# Patient Record
Sex: Male | Born: 1935 | Race: White | Hispanic: No | Marital: Married | Smoking: Former smoker
Health system: Southern US, Community
[De-identification: ages and names within clinical notes are randomized; demographics above are authoritative.]

## PROBLEM LIST (undated history)

## (undated) DIAGNOSIS — E039 Hypothyroidism, unspecified: Secondary | ICD-10-CM

## (undated) DIAGNOSIS — H547 Unspecified visual loss: Secondary | ICD-10-CM

## (undated) DIAGNOSIS — E119 Type 2 diabetes mellitus without complications: Secondary | ICD-10-CM

## (undated) DIAGNOSIS — E785 Hyperlipidemia, unspecified: Secondary | ICD-10-CM

## (undated) DIAGNOSIS — C801 Malignant (primary) neoplasm, unspecified: Secondary | ICD-10-CM

## (undated) HISTORY — PX: CHOLECYSTECTOMY: SHX55

## (undated) HISTORY — PX: OTHER SURGICAL HISTORY: SHX169

---

## 2000-10-26 ENCOUNTER — Encounter: Payer: Self-pay | Admitting: *Deleted

## 2000-10-26 ENCOUNTER — Emergency Department (HOSPITAL_COMMUNITY): Admission: EM | Admit: 2000-10-26 | Discharge: 2000-10-26 | Payer: Self-pay | Admitting: *Deleted

## 2000-10-31 ENCOUNTER — Inpatient Hospital Stay (HOSPITAL_COMMUNITY): Admission: RE | Admit: 2000-10-31 | Discharge: 2000-11-03 | Payer: Self-pay | Admitting: Internal Medicine

## 2004-10-26 ENCOUNTER — Ambulatory Visit (HOSPITAL_COMMUNITY): Admission: RE | Admit: 2004-10-26 | Discharge: 2004-10-26 | Payer: Self-pay | Admitting: Internal Medicine

## 2005-06-13 ENCOUNTER — Ambulatory Visit (HOSPITAL_COMMUNITY): Admission: RE | Admit: 2005-06-13 | Discharge: 2005-06-13 | Payer: Self-pay | Admitting: Internal Medicine

## 2005-06-13 ENCOUNTER — Ambulatory Visit: Payer: Self-pay | Admitting: Internal Medicine

## 2005-06-13 ENCOUNTER — Encounter (INDEPENDENT_AMBULATORY_CARE_PROVIDER_SITE_OTHER): Payer: Self-pay | Admitting: *Deleted

## 2005-12-05 ENCOUNTER — Inpatient Hospital Stay (HOSPITAL_COMMUNITY): Admission: EM | Admit: 2005-12-05 | Discharge: 2005-12-12 | Payer: Self-pay | Admitting: Emergency Medicine

## 2005-12-05 ENCOUNTER — Ambulatory Visit: Payer: Self-pay | Admitting: Internal Medicine

## 2007-04-29 ENCOUNTER — Ambulatory Visit: Payer: Self-pay | Admitting: Orthopedic Surgery

## 2007-04-29 DIAGNOSIS — M24549 Contracture, unspecified hand: Secondary | ICD-10-CM

## 2007-04-30 ENCOUNTER — Telehealth: Payer: Self-pay | Admitting: Orthopedic Surgery

## 2007-05-08 ENCOUNTER — Encounter: Payer: Self-pay | Admitting: Orthopedic Surgery

## 2008-10-30 ENCOUNTER — Ambulatory Visit (HOSPITAL_COMMUNITY): Admission: RE | Admit: 2008-10-30 | Discharge: 2008-10-30 | Payer: Self-pay | Admitting: Internal Medicine

## 2008-11-09 ENCOUNTER — Ambulatory Visit (HOSPITAL_COMMUNITY): Admission: RE | Admit: 2008-11-09 | Discharge: 2008-11-09 | Payer: Self-pay | Admitting: Internal Medicine

## 2008-11-20 ENCOUNTER — Encounter: Payer: Self-pay | Admitting: Internal Medicine

## 2008-11-23 ENCOUNTER — Encounter (INDEPENDENT_AMBULATORY_CARE_PROVIDER_SITE_OTHER): Payer: Self-pay | Admitting: Interventional Radiology

## 2008-11-23 ENCOUNTER — Ambulatory Visit (HOSPITAL_COMMUNITY): Admission: RE | Admit: 2008-11-23 | Discharge: 2008-11-23 | Payer: Self-pay | Admitting: Internal Medicine

## 2008-12-02 ENCOUNTER — Ambulatory Visit (HOSPITAL_COMMUNITY): Admission: RE | Admit: 2008-12-02 | Discharge: 2008-12-02 | Payer: Self-pay | Admitting: Internal Medicine

## 2008-12-07 ENCOUNTER — Encounter: Payer: Self-pay | Admitting: Interventional Radiology

## 2009-06-14 ENCOUNTER — Encounter: Payer: Self-pay | Admitting: Interventional Radiology

## 2009-06-21 ENCOUNTER — Ambulatory Visit (HOSPITAL_COMMUNITY): Admission: RE | Admit: 2009-06-21 | Discharge: 2009-06-21 | Payer: Self-pay | Admitting: Interventional Radiology

## 2010-04-25 LAB — CREATININE, SERUM
Creatinine, Ser: 0.81 mg/dL (ref 0.4–1.5)
GFR calc Af Amer: >60 mL/min (ref >60)
GFR calc non Af Amer: >60 mL/min (ref >60)

## 2010-05-12 LAB — BASIC METABOLIC PANEL
BUN: 13 mg/dL (ref 6–23)
Creatinine, Ser: 0.74 mg/dL (ref 0.4–1.5)
GFR calc non Af Amer: >60 mL/min (ref >60)
Glucose, Bld: 133 mg/dL — ABNORMAL HIGH (ref 70–99)

## 2010-05-12 LAB — APTT: aPTT: 26 seconds (ref 24–37)

## 2010-05-12 LAB — GLUCOSE, CAPILLARY
Glucose-Capillary: 111 mg/dL — ABNORMAL HIGH (ref 70–99)
Glucose-Capillary: 115 mg/dL — ABNORMAL HIGH (ref 70–99)

## 2010-05-12 LAB — CBC
Platelets: ADEQUATE 10*3/uL (ref 150–400)
RDW: 12.8 % (ref 11.5–15.5)

## 2010-05-12 LAB — PROTIME-INR: INR: 0.91 (ref 0.00–1.49)

## 2010-06-24 NOTE — Group Therapy Note (Signed)
[name], [name]                ACCOUNT NO.:  [id number]   MEDICAL RECORD NO.:  [id number]          PATIENT TYPE:  INP   LOCATION:  A327                          FACILITY:  [facility]   PHYSICIAN:  Angus G. Renard Matter, MD   DATE OF BIRTH:  [birth year]   DATE OF PROCEDURE:  12/07/2005  DATE OF DISCHARGE:                                   PROGRESS NOTE   This patient was admitted with upper abdominal pain. He had an abnormal CT  of the abdomen with a 3.5 x 2.2-cm ill-defined lesion on the inferior margin  of the uncinate process of the pancreas. Differential diagnosis is focal  pancreatitis, pseudocyst, pancreatic carcinoma.  The patient did have EGD  yesterday which showed a small hiatal hernia, no neoplasm or ulcer seen. [name], M.D. suspected focal pancreatitis but neoplasm could not  completely be ruled out.   OBJECTIVE:  VITAL SIGNS:  Blood pressure 132/66, respirations 20, pulse 84,  temperature 98.5, blood sugars have run from 135-145.  LUNGS:  Clear to P&A.  HEART:  Regular rhythm.  ABDOMEN:  No palpable organs or masses.   ASSESSMENT:  The patient was admitted with above stated problems. Plan is to  continue current regimen.  Continue pain control.  Continue to monitor  diabetes. The patient as stated in consult note will need endoscopic  ultrasound at [facility] in 3 weeks. Continue current regimen.      Angus G. Renard Matter, MD  Electronically Signed     AGM/MEDQ  D:  12/07/2005  T:  12/07/2005  Job:  [id number]

## 2010-06-24 NOTE — Group Therapy Note (Signed)
[name], [name]                ACCOUNT NO.:  [id number]   MEDICAL RECORD NO.:  [id number]          PATIENT TYPE:  INP   LOCATION:  A327                          FACILITY:  [facility]   PHYSICIAN:  Angus G. McInnis, MD   DATE OF BIRTH:  1926-11-30   DATE OF PROCEDURE:  DATE OF DISCHARGE:                                   PROGRESS NOTE   This patient has been followed for what is felt to be focal pancreatitis,  although malignancy has not been totally ruled out.  He developed fever and  rigors 2 days ago but subsequently temperature has been normal and he has  been more comfortable.  He did have on x-ray of chest what was felt to be  developing right lower lobe infiltrate.  Pneumonia could not be excluded.  He did have increase in his white blood count.  Most current CBC shows white  count 9000 with 86 neutrophils, 8 lymphocytes and slightly low serum  potassium at 3.3.   OBJECTIVE:  VITAL SIGNS:  Blood pressure 149/72, respirations 20, pulse 60,  temperature 98.3.  Blood sugars have ranged from 138-244.  LUNGS:  Diminished breath sounds.  HEART:  Regular rhythm.  ABDOMEN:  No palpable organs or masses.   ASSESSMENT:  The patient has had above-stated problems.  He did develop  fever and probable sepsis secondary to pneumonia.  He does have atelectasis  and pleural effusion on the right.  Plan to continue current IV antibiotics.  Continue current stated regimen.      Angus G. Renard Matter, MD  Electronically Signed     AGM/MEDQ  D:  12/10/2005  T:  12/10/2005  Job:  [id number]

## 2010-06-24 NOTE — Group Therapy Note (Signed)
NAME:  [name], [name]                ACCOUNT NO.:  [id number]   MEDICAL RECORD NO.:  [id number]          PATIENT TYPE:  INP   LOCATION:  A327                          FACILITY:  [facility]   PHYSICIAN:  Catalina Pizza, M.D.        DATE OF BIRTH:  [birth year]   DATE OF PROCEDURE:  DATE OF DISCHARGE:                                   PROGRESS NOTE   PROGRESS NOTE:   SUBJECTIVE:  [name] is a 77 year old admitted with acute epigastric  right sided abdominal pain, initially felt secondary to acute pancreatitis  with question of pancreatic cyst but again had another episode of shaking  chills this morning and was seen yesterday by Dr. Karilyn Cota and felt that not  exactly consistent with pancreatitis, so did have contrast given this  morning orally and repeat CT scan which revealed what is believed to be some  type of bowel rupture likely walled off in nature and was seen and evaluated  by Dr. Karilyn Cota at this time.  The patient states that he has decrease in pain  and decrease in nausea at this time from previous.  He denies any specific  chest pain.   OBJECTIVE:  VITAL SIGNS:  Temperature is 98.3, with a T-max of 99.5, blood  pressure has been ranging from 160/88 to 173/88, pulse is 74.  CBG has been  202, 136, 85 respectively.  Saturation 94% on room air.  Respiratory rate is  approximately 20.  GENERAL:  This is an elderly white male lying in bed.  Appears in no acute  distress, mildly flushed appearance.  Somnolent at this time.  HEENT:  Unremarkable.  LUNGS:  A few crackles at the bases bilaterally.  Decreased aeration at the  right lower lobe.  CARDIOVASCULAR:  Regular rate and rhythm.  No murmurs, gallops, or rubs.  ABDOMEN:  Mildly protuberant.  Does have some right lower abdominal pain to  point tenderness as well as some in the right upper quadrant as well, not as  much in the epigastric area as previously.  Does have positive bowel sounds  but hypoactive.  EXTREMITIES:  No lower  extremity edema.  NEUROLOGIC:  No specific deficit appreciated.   LABORATORY WORK:  BNP of 302.  Amylase 27.  Lipase 14.  CBC showed a white  count of 9.1, hemoglobin of 13.2, platelet count of 203 but a significant  left shift with ANC of 8.3.  Magnesium level was 2.  And, B-MET this morning  shows sodium 136, potassium 3.5, chloride 98, CO2 28, glucose 25, BUN 5,  creatinine 0.8, calcium 8.4.   Chest x-ray shows no interval change from previous, does show bilateral  effusions but states that they are stable.   CT of abdomen not yet read at this time but does show some considerable  abnormality in the right duodenal area with possible walled off abscess,  looks like likely perforation in that area and also it shows the pleural  effusions at this time.   IMPRESSION:  This 77 year old gentleman was initially felt to have acute  pancreatitis which  apparently now appears to be likely a walled off abscess  from perforation in the duodenum, ?.   ASSESSMENT/PLAN:  1. Question duodenal perforation.  In discussion with Dr. Karilyn Cota, we will      get further surgery evaluation by Dr. Malvin Johns to see if would like to      perform the surgery or if needs to be sent to a tertiary facility.  We      will broaden his antibiotic coverage with Unasyn at this time.  We will      place an nasogastric tube and decompress the stomach.  2. Type 2 diabetes.  We will continue to monitor closely with sliding      scale insulin for now, given that he will be nothing by mouth related      to surgery.  3. Headache, question whether related to mild low grade sepsis.  We will      continue with as-needed pain medicine for this.  4. Mild sepsis initially felt secondary to pneumonia but likely due to      this perforation.  He does have signs of pleural effusions which may be      reactive in nature and did have a slightly elevated BNP and we will      continue to monitor.  They do appear to be stable at this  time.  5. Elevated troponin.  This is likely due to stress from illness as well      as some stress/strain on the heart.  We will get a cardiologist's      opinion on any pre-op clearance or workup needed from cardiology      standpoint.  Given the slight bump in troponin, he likely did have some      ischemia but this may be related to his sepsis.  6. Hypokalemia.  He was replenished with his potassium and it is at the      low end of normal at this time.  We will need to recheck in the      morning.   DISPOSITION:  We will make sure the patient has good intravenous access,  broaden spectrum on his antibiotics, and we will transfer down to 2A for  closer monitoring given his mild sepsis type picture.      Catalina Pizza, M.D.  Electronically Signed     ZH/MEDQ  D:  12/12/2005  T:  12/12/2005  Job:  [id number]

## 2010-06-24 NOTE — Op Note (Signed)
[name], [name]                ACCOUNT NO.:  [id number]   MEDICAL RECORD NO.:  [id number]          PATIENT TYPE:  AMB   LOCATION:  DAY                           FACILITY:  [facility]   PHYSICIAN:  R. Roetta Sessions, M.D. DATE OF BIRTH:  06-Jun-1926   DATE OF PROCEDURE:  06/13/2005  DATE OF DISCHARGE:                                 OPERATIVE REPORT   PROCEDURE:  Colonoscopy with polypectomy.   INDICATIONS FOR PROCEDURE:  Patient is a pleasant 77 year old Caucasian male  with chronic constipation, referred by Dr. Catalina Pizza for colorectal cancer  screening.  He has never had his lower GI tract evaluated.  There is no  family history of colorectal neoplasia.  Colonoscopy is now being done.  This approach has been discussed with the patient at length.  Potential  risks, benefits and alternatives have been reviewed and questions answered.  He is agreeable.  Please see documentation on the medical record.   PROCEDURE NOTE:  O2 saturation, blood pressure, pulses, and respirations  were monitored throughout the entire procedure.  Conscious sedation with  Versed 2 mg IV, Demerol 50 mg IV.   INSTRUMENT:  Olympus video chip system.   FINDINGS:  Digital rectal exam revealed no abnormalities.   ENDOSCOPIC FINDINGS:  Prep was adequate.   RECTAL:  Examination of the rectal mucosa, including retroflexion of the  anal verge, revealed no abnormalities.   COLON:  The colonic mucosa was surveyed from the rectosigmoid junction  through the left transverse, right colon, to the area of the appendiceal  orifice, the ileocecal valve, and cecum.  These structures were well seen  and photographed for the record.  From this level, the scope was slowly  withdrawn.  All previously mentioned mucosal surfaces were again seen.  The  following abnormalities were noted:  1)  Scattered sigmoid diverticula.  2)  A 6 mm pedunculated polyp at the hepatic flexure.  The polyp was cold-snared  and recovered through the  scope.  The remainder of the colonic mucosa  appeared entirely normal.  The patient tolerated the procedure well and was  reactive.   ENDOSCOPY IMPRESSION:  1.  Normal rectum.  2.  Left-sided diverticula, polyp at the hepatic flexure, cold snared.  The      remainder of the colonic mucosa appeared normal.   RECOMMENDATIONS:  1.  Diverticulosis and constipation literature was provided to [name].  2.  Daily Metamucil and Citrucel fiber supplements.  3.  MiraLax 17 gm orally at bedtime p.r.n. constipation.  4.  Follow up on path.  5.  Further recommendations to follow.      [name], M.D.  Electronically Signed     RMR/MEDQ  D:  06/13/2005  T:  06/13/2005  Job:  [id number]   cc:   Catalina Pizza, M.D.  Fax: [phone]

## 2010-06-24 NOTE — Consult Note (Signed)
NAMEZAKEE, DEERMAN                ACCOUNT NO.:  [id number]   MEDICAL RECORD NO.:  [id number]          PATIENT TYPE:  INP   LOCATION:  A326                          FACILITY:  [facility]   PHYSICIAN:  R. Roetta Sessions, M.D. DATE OF BIRTH:  [birth year]   DATE OF CONSULTATION:  12/06/2005  DATE OF DISCHARGE:                                   CONSULTATION   REASON FOR CONSULTATION:  Abdominal pain.   REQUESTING PHYSICIAN:  Catalina Pizza.   HISTORY OF PRESENT ILLNESS:  The patient is a 77 year old Caucasian  gentleman with acute onset right-sided abdominal pain. Symptoms began  yesterday and were progressive. They became so severe he came to the  emergency department. He denies any associated nausea or vomiting, diarrhea,  melena or rectal bleeding. He has chronic constipation at baseline. Denies  any fevers or chills. He had a CT of the abdomen and pelvis which revealed  ectopic position of the right kidney in the pelvis and a 3.5 x 2.2 cm, ill-  defined, low-attentuation lesion along the inferior margin of the uncinate  process of the pancreas which involved the transverse duodenum. Differential  diagnosis was listed as focal pancreas with pseudocyst, pancreatic  carcinoma, duodenal ulcer mound or duodenal neoplasm. His lipase and LFTs  were all normal. White count normal at 9700. Hemoglobin 14.9. He consumes  about 4 to 5 beers on the weekend; in his youth, he was a more frequent  drinker but no daily use in over 40 years. He denies any history of peptic  ulcer disease or pancreatitis.   MEDICATIONS AT HOME:  1. Metformin 500 mg b.i.d.  2. Lisinopril 5 mg daily.  3. Multivitamin daily.  4. Folic acid daily.  5. Crestor daily.  6. MiraLax every night.  7. Aspirin 81 mg daily.  8. Tylenol p.r.n.   ALLERGIES:  PHENERGAN CAUSES VISUAL HALLUCINATIONS.   PAST MEDICAL HISTORY:  1. 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  2. Diabetes mellitus.  3. Hypercholesterolemia.  4.  Status post cholecystectomy in 2002.  5. He has a small umbilical hernia.  6. He had a colonoscopy in May of 2007 by Dr. Jena Gauss; revealed left-sided      diverticular and hepatic flexure polyp which was hyperplastic.   FAMILY HISTORY:  Negative for chronic GI illnesses, colorectal cancer,  pancreatitis or other GI malignancies.   SOCIAL HISTORY:  Is married and has one daughter. He quit smoking 10 years  ago. He continues to chew tobacco. He drinks 4 to 5 beers per weekend.   REVIEW OF SYSTEMS:  See HPI for GI. CONSTITUTIONAL:  No weight loss.  CARDIOPULMONARY:  No chest pain or shortness of breath. GENITOURINARY:  Complains of urinary hesitancy.   PHYSICAL EXAMINATION:  Temperature 99.8, pulse 87, respirations 20, blood  pressure 143/84. Weight 70.4 kg, height 69 inches.  GENERAL:  Pleasant, somewhat hard of hearing, Caucasian male in no acute  distress.  SKIN:  Warm and dry. No jaundice.  HEENT:  Sclerae nonicteric. Oropharyngeal mucosa moist and pink. No  lymphadenopathy or thyromegaly.  CHEST:  Lungs are clear to auscultation.  CARDIAC EXAMINATION:  Reveals regular rate and rhythm, normal S1 and S2. No  murmurs, rubs, or gallops.  ABDOMEN:  Positive bowel sounds. Abdomen is soft, nondistended. Positive  bowel sounds. He has moderate tenderness throughout the entire right abdomen  and epigastrium to deep palpation. No organomegaly or masses. Subjective  guarding. No rebound tenderness. Small umbilical hernia with a pea-sized  hardening which is not reducible and slightly tender.  EXTREMITIES:  No edema.   LABORATORY DATA:  As outlined above. In addition, platelets 195,000, sodium  141, potassium 4, BUN 9, creatinine 0.8, glucose 116. Total bilirubin 0.6,  alkaline phosphatase 54, AST 18, ALT 14, albumin 4.3, lipase 33.   IMPRESSION:  The patient is a 77 year old gentleman with acute onset of  right-sided abdominal pain with abnormal CT with ill-defined mass as  outlined above.  The patient's pain is right sided, somewhat atypical given  location noted on CT. He also has a small umbilical hernia which is likely  incidental. Dr. Jena Gauss to review CT. Further recommendations to follow but  suspect patient will need EUS.      Tana Coast, P.AJonathon Bellows, M.D.  Electronically Signed    LL/MEDQ  D:  12/06/2005  T:  12/06/2005  Job:  [id number]   cc:   Catalina Pizza, M.D.  Fax: [phone]

## 2010-06-24 NOTE — Group Therapy Note (Signed)
NAME:  LANEY, BAGSHAW                ACCOUNT NO.:  [id number]   MEDICAL RECORD NO.:  [id number]          PATIENT TYPE:  INP   LOCATION:  A327                          FACILITY:  [facility]   PHYSICIAN:  Catalina Pizza, M.D.        DATE OF BIRTH:  February 16, 1926   DATE OF PROCEDURE:  12/11/2005  DATE OF DISCHARGE:                                   PROGRESS NOTE   SUBJECTIVE:  Mr. Salzwedel is a 77 year old gentleman admitted with acute  epigastric right-sided abdominal pain, found to have a 4-cm pancreatic cyst,  believed to be an acute pancreatitis.  Since last dictation, he began having  rigors and shaking chills  on Friday, and felt to have mild sepsis at that  time, and was started on antibiotics.  Since then, he has grown out gram-  negative rod in 1 of 2 blood cultures.  This improved and he did not have  any significant further rigors, but did have elevated temperature of  approximately 103 at that time.  Unclear cause of this mild sepsis.  Did get  cardiac enzymes apparently, and did show some increase in troponin-I, and  appears that he just had some mild ischemia.  He denies any specific chest  pain related to this.  Has had a chest x-ray obtained on the 2nd, which  revealed some bibasilar atelectasis and small right effusion, and had a CT  done the next day which showed negative for pulmonary embolism.  Did show  pleural effusions, right greater than left, with compressive atelectasis.  Unclear whether this is related to either early pneumonia or signs of  congestive heart failure.  Biggest complaints at this time is he continues  to have a period headache, as well as continued nausea and mild abdominal  pain.   OBJECTIVE:  VITAL SIGNS:  Temperature 99.1, T-max last night was 101.3,  blood pressure 150/73, pulse 64, respirations 16.  CBG has been 107, 130,  167.  Sating 92% on room air.  GENERAL:  This is an elderly white male, lying in bed in no acute distress.  Mildly flushed  appearance.  HEENT:  Unremarkable.  Mucous membranes are moist.  No thyromegaly.  No JVD  appreciated.  LUNGS:  A few crackles at the bases bilaterally.  No specific signs of  consolidation on exam.  CARDIOVASCULAR:  Regular rate and rhythm.  No murmurs, gallops, or rubs  appreciated.  ABDOMEN:  Diffuse tenderness throughout.  Some mild protuberance.  No mass  is appreciated.  Positive bowel sounds throughout.  EXTREMITIES:  No lower extremity edema.  SKIN:  No signs of rash.  NEUROLOGIC:  Cranial nerves 2 through 12 intact.  No deficits appreciated.   Lab work obtained as mentioned above, 1 of 2 blood cultures revealed gram-  negative rods in preliminary report from November 2.  BMET shows sodium 138,  potassium 3, chloride 97, CO2 31, glucose 121, BUN 3, creatinine 0.8,  calcium 7.5.  CBC showed a white count of 9.4, hemoglobin 12.1, platelet  count 200.   IMPRESSION:  This 77 year old gentleman  with a 3.5-cm x 2.2-cm, ill-defined  lesion in the pancreas, felt secondary to pancreatitis, who apparently has  had a bout of mild sepsis, and question whether had some coronary event as  well with elevated troponin.   ASSESSMENT AND PLAN:  1. Acute focal pancreatitis.  He continues to have some nausea, requiring      routine Zofran, and has not had much appetite, but is drinking fluids      well without difficulty.  Defer any further treatment decisions to      Gastrointestinal.  As far as the pancreatitis is concerned, will need      to be scheduled to go to [facility] for an endoscopic ultrasound of the      pancreas to further define this in approximately 3 weeks.  2. Type 2 diabetes.  Will continue on his current insulin regimen at this      time.  Blood sugars appear to be doing well.  He has had not      significant appetite, so has not had very much hyperglycemia since      starting on sliding scale.  His last A1c was 6.2 in October, and was      only on metformin at that time.  3.  Elevated troponin.  Question whether related to some mild congestive      heart failure given the pleural effusions, as well as whether having      some ischemic type event mild on Friday, but will get Cardiology offer      any further opinion and treatment options for this.  He has been risk      stratified before, and his blood pressure and cholesterol have been      recently checked, and under good control on previous medicines.  4. Headache.  Unclear exact cause of this.  He has had off and on      headaches and takes medicine for sinus type infection.  Will continue      with the Darvocet p.r.n. for this.  Tylenol apparently does not help      with this.  5. Question of a mild sepsis secondary to pneumonia.  There is no specific      source, but did have a question on chest x-ray.  We will repeat chest x-      ray in the morning, and see if any further signs of pneumonia.  He was      started on Levaquin and he has not had any significant fever since      episode on Friday.  We will follow up on results from the blood      culture, specifically find out what type bug he had.  6. Hypokalemia.  We will replete his potassium with runs of potassium due      to his nausea.  Feel may be made worse by potassium.  We will replete      and recheck in the morning as well as magnesium level.      Catalina Pizza, M.D.  Electronically Signed     ZH/MEDQ  D:  12/11/2005  T:  12/11/2005  Job:  [id number]

## 2010-06-24 NOTE — Op Note (Signed)
[name], [name]                ACCOUNT NO.:  [id number]   MEDICAL RECORD NO.:  [id number]          PATIENT TYPE:  INP   LOCATION:  A326                          FACILITY:  [facility]   PHYSICIAN:  R. Roetta Sessions, M.D. DATE OF BIRTH:  1926-07-18   DATE OF PROCEDURE:  12/06/2005  DATE OF DISCHARGE:                                 OPERATIVE REPORT   PROCEDURE:  Diagnostic EGD.   INDICATIONS FOR PROCEDURE:  The patient is a 76 year old gentleman with  acute onset right-sided abdominal pain. CT demonstrates a mass very close to  and possibly following the uncinate process, some possible involvement of  the third portion of the duodenum.  I reviewed the OCD films with Dr. Alver Fisher.  We would favor this lesion primarily arising from the pancreas.  His  amylase, lipase, and LFTs are notably normal.  He has had symptoms  intermittently previously. EGD is now being done to rule out a mucosal  process of the duodenum.  This approach has been discussed with the patient  at length.  Potential risks, benefits, and alternatives have been reviewed,  questions answered, and he is agreeable.  Please see documentation in the  medical record.   PROCEDURE NOTE:  O2 saturation, blood pressure, and pulse oximetry monitored  throughout the entire procedure.   CONSCIOUS SEDATION:  Versed 1 mg IV, Demerol 25 mg IV in divided doses.   INSTRUMENT:  Olympus video chip system.   FINDINGS:  Examination of the tubular esophagus revealed no mucosal  abnormalities.  EG junction easily traversed.  Stomach:  Gastric cavity was  emptied, insufflated well with air.  Thorough examination of the gastric  mucosa revealed retroflexion in the proximal stomach.  Esophagogastric  junction demonstrated only a small hiatal hernia.  Pylorus was patent,  easily traversed.  Examination of the bulb, second, and third portion was  undertaken.  There was some mucosal edema versus some bulging from extrinsic  compression along the  medial wall of the third portion of the duodenum.  However, the mucosa itself appeared entirely normal.   THERAPEUTIC/DIAGNOSTIC MANEUVERS PERFORMED:  None.   The patient tolerated the procedure well and was reactivated.   ENDOSCOPIC IMPRESSION:  Normal esophagus small hiatal hernia, otherwise  normal stomach. Subtle submucosal edema versus bulging medial wall of the  third portion of duodenum, otherwise negative upper GI tract.   DISCUSSION:  I suspect focal pancreatitis versus a neoplastic process  (favor of the former, with the acute onset of symptoms, but notably the  serum amylase and lipase were initially normal).   RECOMMENDATIONS:  Would treat clinically as a pancreatitis for the time  being.  Will repeat serum amylase and lipase today.  The patient will need  an endoscopic ultrasound at [facility] in 3-4 weeks. If this is benign  pancreatitis, the pancreatitis needs to settle down prior to attempting  endoscopic ultrasound because it is very difficult to sort out benign  pancreatic inflammation from neoplasia when inflammation is severe.      [name], M.D.  Electronically Signed     RMR/MEDQ  D:  12/06/2005  T:  12/06/2005  Job:  [id number]   cc:   Catalina Pizza, M.D.  Fax: [phone]

## 2010-06-24 NOTE — Discharge Summary (Signed)
[name], [name]                ACCOUNT NO.:  [id number]   MEDICAL RECORD NO.:  [id number]          PATIENT TYPE:  INP   LOCATION:  A327                          FACILITY:  [facility]   PHYSICIAN:  Catalina Pizza, M.D.        DATE OF BIRTH:  1926/09/14   DATE OF ADMISSION:  12/06/2005  DATE OF DISCHARGE:  11/06/2007LH                               DISCHARGE SUMMARY   DISCHARGE DIAGNOSES:  1. Duodenal perforation with abscess.  2. Mild septicemia.  3. Elevated troponin with mild ischemia.  4. Headache.  5. Type 2 diabetes.  6. Hypokalemia  7. Hypertension.  8. Hypercholesterolemia.   DISCHARGE MEDICATIONS:  1. Sliding-scale insulin.  2. Protonix 40 mg p.o. daily.  3. Benadryl 25 mg p.o. nightly.  4. Albuterol 2.5 mg inhaled q.4 h. p.r.n.  5. Atrovent 0.5 mg inhaled q.6 h. p.r.n..  6. Potassium chloride 20 mEq p.o. daily.  7. Unasyn unknown dose.   At time of discharge, the patient was transferred to [facility] and many of  his other medications were not resumed until the patient was more  stable, so I have an incomplete list at this time.   BRIEF HISTORY OF PRESENT ILLNESS:  [name] is a 77 year old  gentleman who presented on October 31 with complaint of severe abdominal  pain and right-sided in nature and was having a belching-type feeling,  no significant nausea or vomiting, denied any problems with stools.  Initial CT scan did show a 3.5 x 2.2-cm low-attenuated lesion in the  head of the pancreas involving the transverse duodenum, initially felt  secondary to pancreatitis.  He was seen and evaluated by GI and  continued with n.p.o. status and pain management for pancreatitis, but  did not have any significant improvement.  He did have several episodes  of shaking-type chills and reassessed with further CT scan after Dr.  Karilyn Cota had reevaluated the patient and felt that this more likely  related to perforation, did have some free air seen on scan and the  patient was  transferred to [facility] for further studies and intervention,  accepted by Dr. Lorin Picket.  Please refer to dictation on November 6 for  routine exam findings.   Discharge blood work revealed normal BMET, except for glucose of 105,  BUN of 5, creatinine of 0.8.  Magnesium level was 2.0.  CBC showed a  white count of 9.1, hemoglobin of 13.2, but significant left shift with  ANC of 8.3, neutrophils of 91%.  Lipase was 14.  Amylase was 27.  BNP  was 302.  Blood cultures x2; one was negative; another one showed  Bacteroides fragilis.   Images obtained during hospitalization:  As mentioned above, initial CT  showed ill-defined low-attenuation, approximately 3.5 x 2.2-cm, in the  uncinate process and involving the transverse duodenum, unclear exact  cause, pseudocyst, pancreatic carcinoma, duodenal ulcer, duodenal  neoplasm all possible, no evidence of free air or abscess at that time.  Chest x-ray revealed bibasilar atelectasis, worse on the right,  developing right lower lobe infiltrate or pneumonia is not excluded,  small right  effusion.  CT angiogram of chest due his chest pain was  obtained, showing negative for PE, pleural effusions, right greater than  left, with  compressive atelectasis.  Chest x-ray on November 6 revealed  bibasilar opacities and bilateral effusions, both stable, no significant  change.  CT scan obtained on November 6 revealed small foci of gas with  collection measuring 3.6 to 3.7 adjacent to the duodenum; I believe this  was compatible with a duodenal perforation and hemorrhage, question of  ulcer disease, duodenal carcinoma or lymphoma, felt less likely to  represent a pancreatic etiology at this time.  Did also note some mild  prostatic enlargement and some free intraperitoneal fluid, but otherwise  did not reveal any other abnormalities.   HOSPITAL COURSE:  Problem #1 - ABDOMINAL PAIN AND QUESTION DUODENAL  PERFORATION:  Once I felt that he did not have  significant signs of  pancreatitis and began having issues with mild sepsis type with rigors  and spiking fevers, his antibiotic coverage was broadened and finally  determined that this was likely due to a duodenal perforation and did  have 1 blood culture which, as mentioned above, did grow out a  Bacteroides fragilis.  In discussion with Dr. Karilyn Cota, did briefly  consult Dr. Malvin Johns, but he deferred to surgeon at [facility] and the  patient was transferred for further testing and intervention at that  time.   PROBLEM #2 - ELEVATED TROPONIN AND MILD ISCHEMIA:  Several reason why  the patient may have had this, given the stress of septicemia.  He did  have a troponin I reach a level was 0.17, did have routine EKGs which  not show any significant ST wave changes.  I was going to get Cardiology  involved, but the patient was transferred prior to this.  He did have  slight elevation of BNP as well, but no significant history of coronary  artery disease previous.   PROBLEM #3 - DIABETES MELLITUS, TYPE 2:  During hospitalization,  continued with sliding-scale insulin, given his acute illness, trying to  maintain blood sugars as low as possible and will need to be resumed on  his medicines upon discharge from the hospital.   DISPOSITION:  I have discussed all of this with his wife and Dr. Karilyn Cota  as well and the patient will be transferred to [facility] for further  intervention, question whether to do surgery or do CT-guided drainage of  abscess, likely will need an endoscopic ultrasound done.Catalina Pizza, M.D.  Electronically Signed     ZH/MEDQ  D:  01/10/2006  T:  01/11/2006  Job:  [id number]

## 2010-06-24 NOTE — H&P (Signed)
NAMETENOCH, MCCLURE                ACCOUNT NO.:  [id number]   MEDICAL RECORD NO.:  [id number]          PATIENT TYPE:  INP   LOCATION:  A326                          FACILITY:  [facility]   PHYSICIAN:  Catalina Pizza, M.D.        DATE OF BIRTH:  Jul 04, 1926   DATE OF ADMISSION:  12/05/2005  DATE OF DISCHARGE:  LH                                HISTORY & PHYSICAL   REASON FOR ADMISSION:  Severe abdominal pain   HISTORY OF PRESENT ILLNESS:  Mr. Colquhoun is a 77 year old gentleman with  acute epigastric right-sided abdominal pain radiating to his back which  started yesterday morning. He woke up in his usual state of health doing  well but then began developing more severe pain in this area and noted that  he was belching more. He did not have any significant nausea or vomiting.  No change in stools. Denied any fever or chills.  Went throughout the day  and became bad enough that he was brought to the emergency department for  further evaluation. At that time, CT scan of his abdomen showed a 3.5 x 2.2  cm low-attenuation lesion in his pancreas involving the transverse duodenum.  All lab work was obtained and is listed below. He was seen and evaluated by  GI this morning and  had EGD done which only revealed a hiatal hernia.  He  is rather somnolent at this time due to medications.   PAST MEDICAL HISTORY:  1. Diabetes mellitus, type 2, under good control. Last A1c done on October      1 revealed a hemoglobin A1c of 6.2.  2. Hypercholesterolemia.  3. Status post cholecystectomy in 2002 by Dr. Leona Carry.  4. Oral throat cancer in approximately 1999, seen by Dr. Claudette Head in      [place], had x-ray therapy at that time.  5. Hypertension.  6. Dupuytren contractures  7. Significant smoking history, 60+ pack year, quit approximately 3-4      years ago.   ALLERGIES:  PHENERGAN which causes hallucinations.   MEDICATIONS AT HOME:  1. Coated aspirin 81 mg daily.  2. Vitamin C  3. Saw palmetto.  4.  Fish oil 2 pills a day.  5. Folic acid daily.  6. Selenium daily.  7. Multivitamin daily.  8. Metformin 500 mg b.i.d.  9. Crestor 5 mg daily.  10.Glucolax 1 scoop nightly p.r.n. for constipation.  11.Lisinopril 2.5 mg daily.  12.Flomax 0.4 mg daily but apparently is not taking that at this time.   FAMILY HISTORY:  Father died at age 41 of unknown causes.  Mother had  hypertension.  Died at 61 likely of brain aneurysm. No other history of  colon cancer in family.   SOCIAL HISTORY:  Married, has one daughter. Quit smoking approximately 5  years ago, occasionally chews tobacco. Might have five to eight beers per  week, more of this on the weekend but no more than 2-3.   REVIEW OF SYSTEMS:  Relatively negative at this time. Denies any problems  with shortness of breath.  No chest pain.  No  significant abdominal pain.  No nausea or vomiting. Still does have some urinary hesitancy at this time.   PHYSICAL EXAMINATION:  VITAL SIGNS:  Temperature is 99.4, blood pressure  108/63, pulse 73, respirations 20.  CBGs have been 145, 131, 135,  respectively.  Saturating 95% on room air.  GENERAL:  This is a pleasant white male, somnolent from pain medicine at  this time.  SKIN: Warm and dry.  HEENT: Nonicteric.  Oropharynx is clear.  NECK:  No lymphadenopathy.  No thyromegaly.  CHEST: Lungs clear to auscultation bilaterally.  CARDIAC: Regular rate and rhythm.  No murmurs, rubs, or gallops appreciated.  ABDOMEN: Positive bowel sounds.  Abdomen is soft, does have some moderate  tenderness in the right abdomen and mild to deep palpation in the epigastric  area.  No significant rebound or guarding. Does have small umbilical hernia.  EXTREMITIES:  No lower extremity edema.   LABORATORY DATA:  The patient's the UA is totally normal.  Lipase is 33.  CMP shows sodium 141, potassium 4.0, chloride 103, CO2 32, glucose 116, BUN  9, creatinine 0.8, total bilirubin 0.6, alkaline phosphatase 54, SGOT 18,   SGPT 14, total protein 7.1, albumin 4.3, calcium 9.4.  CBC showed white  count 9.7, hemoglobin 14.9 platelet count 195.   IMAGES OBTAINED:  CT of abdomen and pelvis reveal ill-defined low-  attenuation lesion along the inferior aspect of the uncinate process  involving his transverse duodenum, may be focal pancreatitis with pseudocyst  but cannot totally rule out neoplasm.   Pelvic CT shows right pelvic kidney incidentally noted.   IMPRESSION:  A 77 year old gentleman with acute onset of abdominal pain with  unspecified pancreatic mass, likely pancreatitis.   ASSESSMENT/PLAN:  Focal pancreatitis.  I am treating this lesion at this.  His symptoms have improved with significant amount of pain medicine, and  will follow GI's workup closely for this.  He was seen and assessed by Dr.  Jena Gauss today and had EGD done which did not reveal any significant findings.  Suggested that he will likely need an endoscopic ultrasound at [facility] in  approximately 3-4 weeks, will continue on a clear liquid diet per GI's  recommendations, decrease his pain medicine significantly given his  somnolence, and repeat amylase and lipase today to see if there is any  change in this as compared to before.      Catalina Pizza, M.D.  Electronically Signed    ZH/MEDQ  D:  12/06/2005  T:  12/06/2005  Job:  [id number]

## 2010-06-24 NOTE — Group Therapy Note (Signed)
[name], [name]                ACCOUNT NO.:  [id number]   MEDICAL RECORD NO.:  [id number]          PATIENT TYPE:  INP   LOCATION:  A327                          FACILITY:  [facility]   PHYSICIAN:  Joel G. McInnis, MD   DATE OF BIRTH:  [birth year]   DATE OF PROCEDURE:  DATE OF DISCHARGE:                                   PROGRESS NOTE   The patient was admitted with epigastric and right sided abdominal pain  thought to be due to focal pancreatitis, although malignancy has not been  totally ruled out. His condition was stable until late in the afternoon when  he developed an acute fever with rigors.  Lab studies show white count of  4000, hemoglobin 13.4, hematocrit 38.4. Electrolytes showed sodium 137,  potassium 3.3, chloride 104, CO2 28, glucose 137, BUN 6, creatinine 0.8. A  chest x-ray was obtained and showed evidence of lower lobe field infiltrate  compatible with pneumonia. Blood cultures were obtained. The patient was  started on IV Levaquin. Cardiac markers were obtained and have been normal  so far. The patient is much more comfortable.   OBJECTIVE:  VITAL SIGNS:  Blood pressure 118/64, respiration 24, pulse 52,  temperature 97.5, blood sugars have ranged from 98 to 171, blood gases  showed pH of 7.422 with a pCO2 41, pO2 97.  HEART:  Regular rhythm.  LUNGS:  Clear to P&A.  ABDOMEN:  No palpable organs or masses.   ASSESSMENT:  The patient is being treated for focal pancreatitis, although  neoplasia cannot be totally ruled out. He did develop acute fever and  chills. There is a question of sepsis.  He does have an area of pneumonia  noted on chest x-ray on the lower lung.  We plan to continue IV antibiotics,  neb treatments, nasal O2.  We will cycle cardiac enzymes and obtain CT of  the chest to rule out PE.      Joel G. Renard Matter, MD  Electronically Signed     AGM/MEDQ  D:  12/09/2005  T:  12/09/2005  Job:  [id number]

## 2010-06-24 NOTE — Group Therapy Note (Signed)
[name], [name]                ACCOUNT NO.:  [id number]   MEDICAL RECORD NO.:  [id number]          PATIENT TYPE:  INP   LOCATION:  A327                          FACILITY:  [facility]   PHYSICIAN:  Catalina Pizza, M.D.        DATE OF BIRTH:  July 11, 1926   DATE OF PROCEDURE:  12/08/2005  DATE OF DISCHARGE:                                   PROGRESS NOTE   SUBJECTIVE:  [name] is a 77 year old gentleman who was admitted for  acute epigastric right-sided abdominal pain, felt to likely be acute focal  pancreatitis.  He was seen and assessed by GI and continues to be followed  by them as well.  He continues to have diffuse abdominal pain but much  better, no severe pain as before.  He did have some constipation, which he  does have chronic constipation and did have bowel movement following enema  today.  He had no other complaints about chest pains or shortness of breath.   OBJECTIVE:  GENERAL APPEARANCE:  This is an elderly white male lying in bed  in no acute distress.  VITAL SIGNS:  Temperature is 97.4, blood pressure ranging from 138/66 to  162/68, pulse 60, respirations 18-20. CBGs have been 120, 120, 174, 123,  respectively.  HEENT:  Unremarkable.  Oropharynx:  Mucous membranes are moist.  NECK:  No thyromegaly.  No carotid bruits.  LUNGS:  Clear to auscultation bilaterally.  CARDIOVASCULAR:  Regular rate and rhythm, no murmurs, rubs, or gallops.  ABDOMEN:  Diffuse tenderness but more point tenderness in the epigastric  area as well as in the suprapubic area.  No masses appreciated.  EXTREMITIES:  No lower extremity edema.   LABORATORY DATA:  No new lab work obtained.   IMPRESSION:  This is a 77 year old gentleman with a 3.5 x 2.2 cm ill defined  lesion in the pancreas.  I did have an EGD done which did not reveal any  specific findings.   ASSESSMENT/PLAN:  1. Acute focal pancreatitis.  At this time, this is what we are treating      him for.  He is tolerating clear diet at this  time and will defer to      gastrointestinal whether to advance his diet at this time to a diabetic      medication.  2. Diabetes type 2, was previously on metformin 500 mg b.i.d. for this and      has been started on sliding scale insulin while he is in the hospital      acutely ill but will likely need to be resumed on this medicine.  His      last hemoglobin A1c was 6.2 done in October.  3. Abdominal pain and pain management.  Patient is getting IV pain      medicine at this time but will need to transition over to oral medicine      for pain control prior to discharge.  4. Hyperlipidemia.  Will need to continue on the Crestor upon discharge.   DISPOSITION:  If diet is advanced on an oral  pain medicine, patient will  need to follow up with a scan at [facility] in the next three weeks per GI's  recommendation and will follow up with GI as well as myself in several  weeks.      Catalina Pizza, M.D.  Electronically Signed     ZH/MEDQ  D:  12/08/2005  T:  12/08/2005  Job:  [id number]

## 2011-02-24 IMAGING — XA IR KYPHOPLASTY LUMBAR INIT
10 of 11 series · 13 of 21 positions shown · IV contrast (IODINE)
Comparison: CT scan of the lumbar spine of 11/09/2008.

CLINICAL DATA: Severe low back pain secondary to compression
fracture at L2.

LUMBAR KYPHOPLASTY AT L2

[Series 1: myelo · 1 of 2 slices shown (1 of 8)]
[im 1/2]
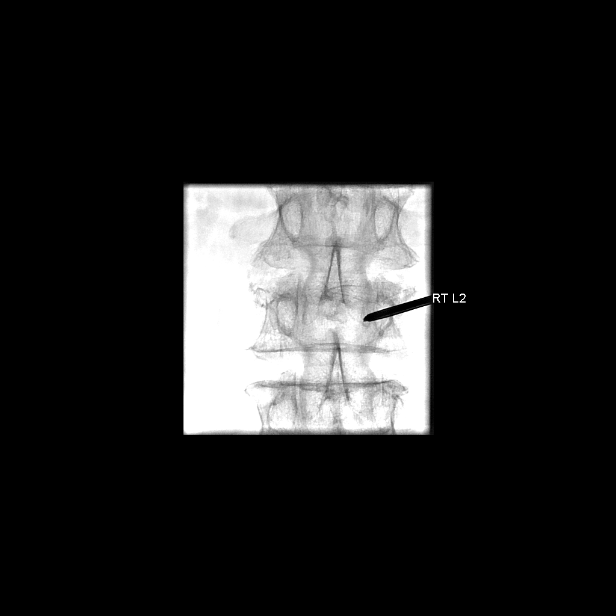

[Series 2: myelo · 1 of 2 slices shown (2 of 8)]
[im 1/2]
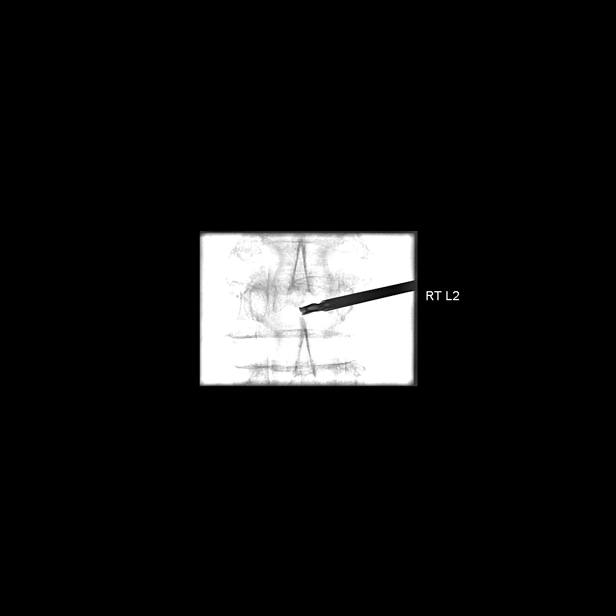

[Series 5: myelo · 2 of 2 slices shown (3 of 8)]
[im 1/2]
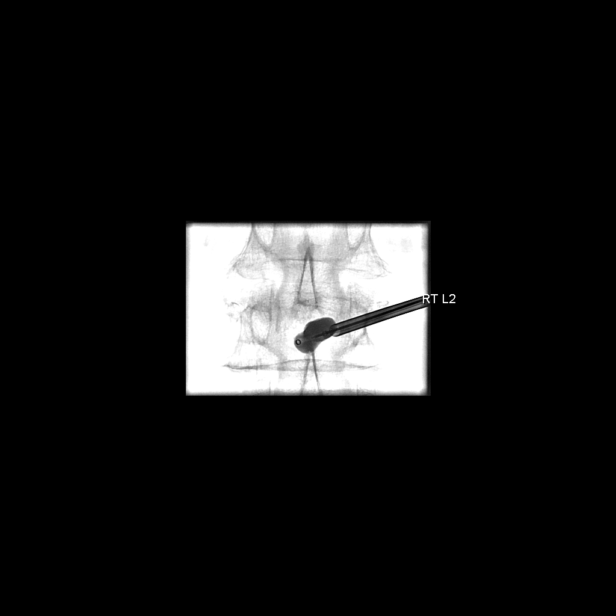
[im 2/2]
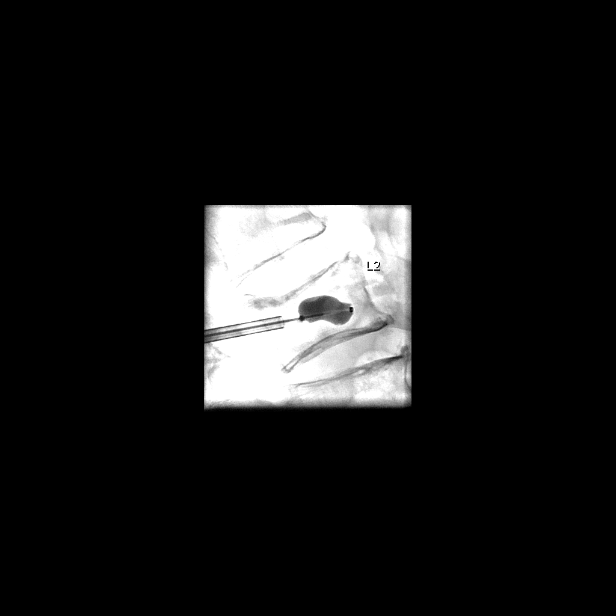

[Series 6: myelo · 1 of 2 slices shown (4 of 8)]
[im 2/2]
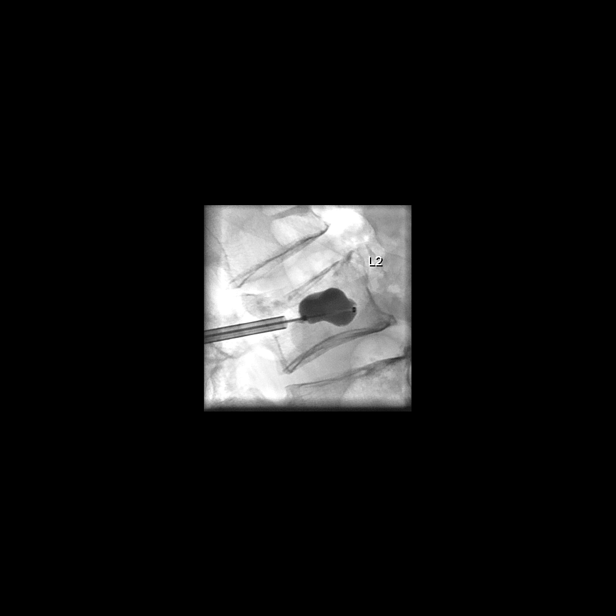

[Series 9: myelo · 1 of 2 slices shown (5 of 8)]
[im 1/2]
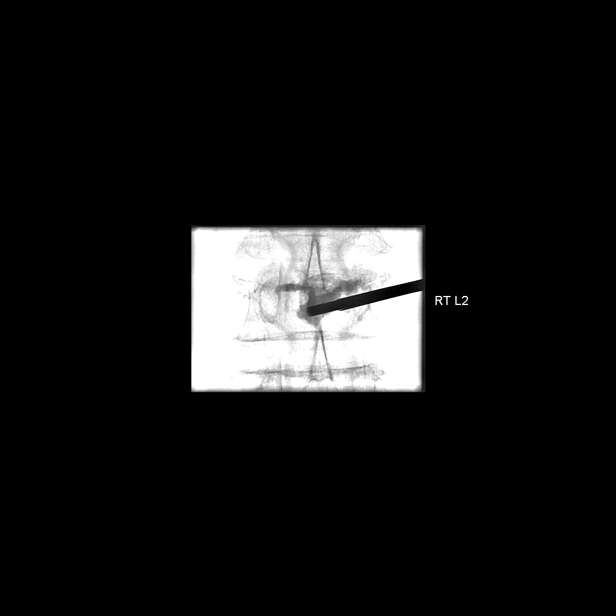

[Series 10: myelo · 2 of 3 slices shown (6 of 8)]
[im 1/3]
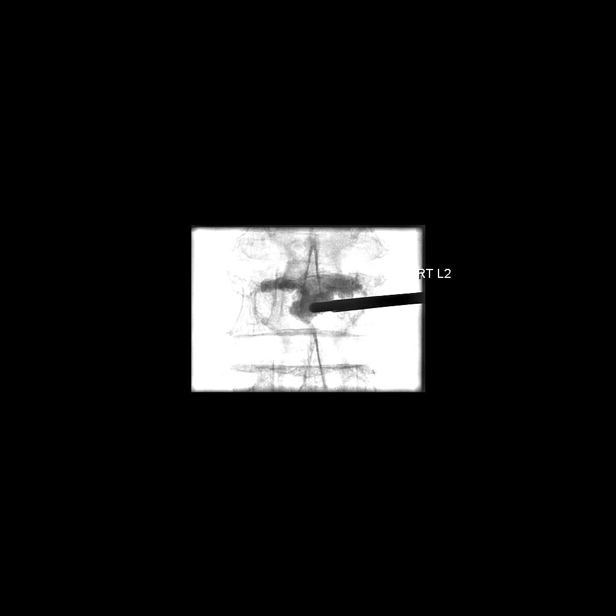
[im 3/3]
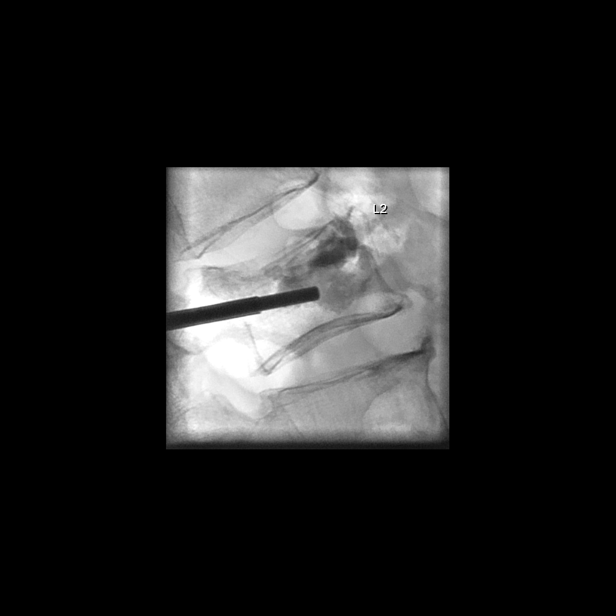

[Series 11: myelo · 1 of 2 slices shown (7 of 8)]
[im 1/2]
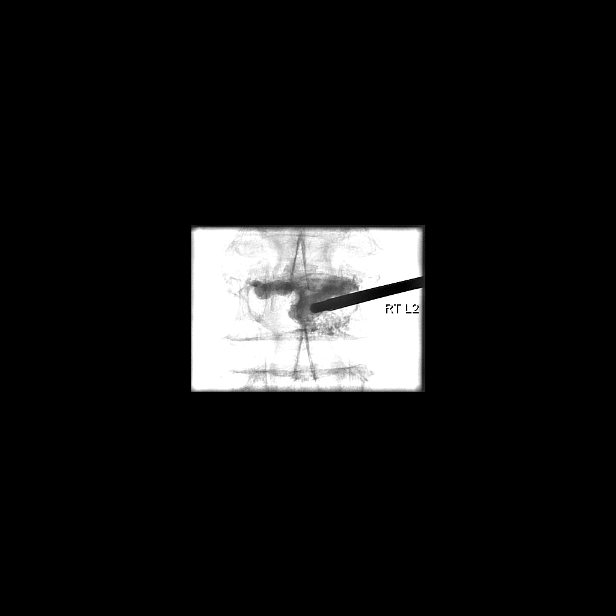

[Series 12: myelo · 2 of 2 slices shown (8 of 8)]
[im 1/2]
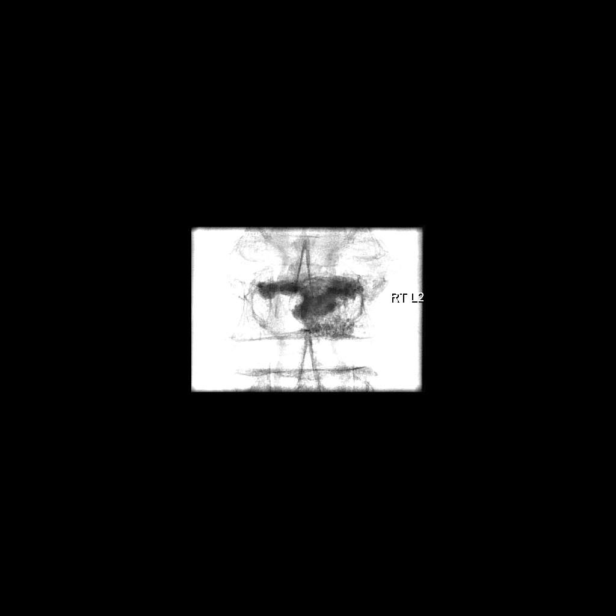
[im 2/2]
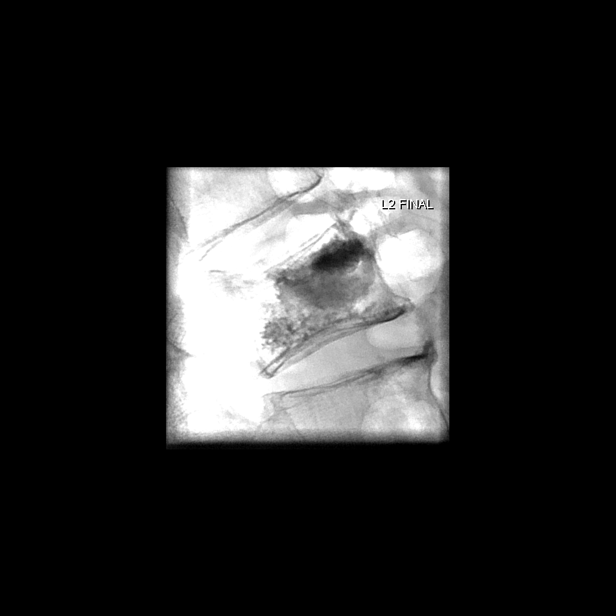

[Series 14: kyphoplasty · 1 of 2 slices shown]
[im 2/2]
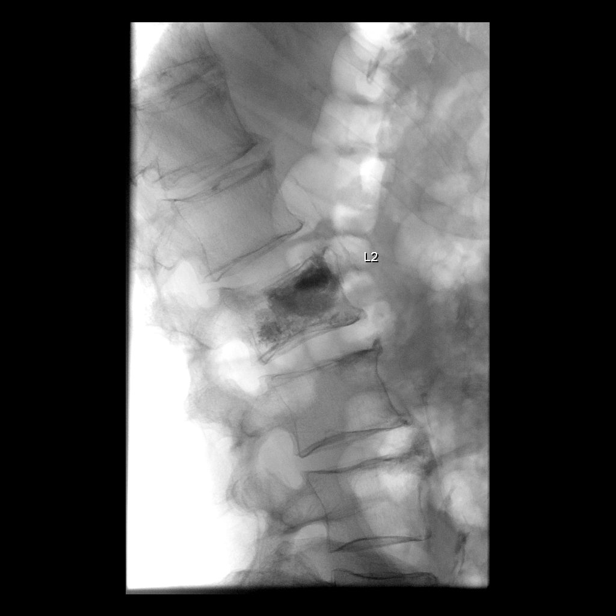

[Series 16: fl neuro 2 · 1 of 1 slices shown]
[im 1/1]
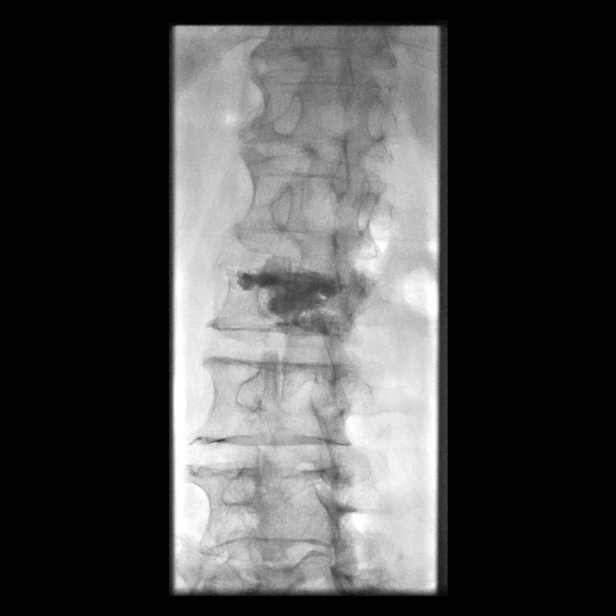

[13 of 21 positions shown; findings below may reference images not displayed]

Following a full explanation of the procedure along the potential
associated complications, an informed witnessed consent was
obtained.

The patient was placed prone on the fluoroscopic table.  The skin
overlying the lumbar region was then prepped and draped in the
usual sterile fashion.

The right L2 pedicle was then identified and infiltrated with 0.25%
bupivacaine followed by the advancement of an 11-gauge Jamshidi
needle into the posterior one-third at L2.  This was then exchanged
out for the Kyphon advanced osteo introducer system comprised of
the working cannula and a Kyphon osteo drill over a Kyphon osteo
bone pin. This combination was then advanced over the Kyphon osteo
bone pin until the Kyphon osteo drill was in the posterior one-
third at L2.  The Kyphon osteo bone pin was removed. In a medial
trajectory the combination was advanced until the tip of the
working cannula was inside the posterior one-third at L2.  The
Kyphon osteo drill was removed and a core sample sent for
pathologic analysis.

Through the working cannula, the Kyphon bone biopsy device was
advanced to within 5 mm of the anterior aspect of L2.  A core
sample from this was also sent for pathologic analysis.

Through the working cannula, a Kyphon inflatable bone tamp 20 x 3
was advanced and positioned such that the distal marker was 5 mm
from the anterior aspect of L2.  This balloon was then expanded
with contrast using the Kyphon inflation syringe device via
microtubing.  Inflation was continued until there was apposition
with the superior endplate.

At this time methylmethacrylate mixture was reconstituted with
Tobramycin in the Kyphon bone mixing device system.  This was then
loaded onto Kyphon bone fillers.  The balloon was deflated and
removed followed by the instillation of 1.5 bone filler equivalents
of methylmethacrylate mixture at L2.  Excellent filling was
obtained in the AP and lateral projections.

There was no extravasation noted into disc spaces or posteriorly
into the spinal canal or into the epidural veins. The patient
tolerated the procedure well.  There were no acute complications.

Medications utilized: Versed 3 mg IV.  Fentanyl 75 mcg IV.

The working cannula and the bone filler were then removed and
hemostasis achieved at the skin entry site.
IMPRESSION: 1.  Status post fluoroscopic-guided needle placement for deep core
bone biopsy at L2.
2.  Status post vertebral body augmentation for painful
osteoporotic compression fracture at L2 using the balloon
kyphoplasty technique.

## 2011-06-23 ENCOUNTER — Other Ambulatory Visit (HOSPITAL_COMMUNITY): Payer: Self-pay | Admitting: Internal Medicine

## 2011-06-23 DIAGNOSIS — I739 Peripheral vascular disease, unspecified: Secondary | ICD-10-CM

## 2011-06-30 ENCOUNTER — Ambulatory Visit (HOSPITAL_COMMUNITY)
Admission: RE | Admit: 2011-06-30 | Discharge: 2011-06-30 | Disposition: A | Discharge status: Home / Self Care | Payer: Medicare Other | Source: Ambulatory Visit | Attending: Internal Medicine | Admitting: Internal Medicine

## 2011-06-30 ENCOUNTER — Other Ambulatory Visit (HOSPITAL_COMMUNITY): Payer: Self-pay | Admitting: Internal Medicine

## 2011-06-30 DIAGNOSIS — I739 Peripheral vascular disease, unspecified: Secondary | ICD-10-CM | POA: Insufficient documentation

## 2011-06-30 DIAGNOSIS — R109 Unspecified abdominal pain: Secondary | ICD-10-CM | POA: Insufficient documentation

## 2011-06-30 DIAGNOSIS — I714 Abdominal aortic aneurysm, without rupture, unspecified: Secondary | ICD-10-CM | POA: Insufficient documentation

## 2011-06-30 DIAGNOSIS — R209 Unspecified disturbances of skin sensation: Secondary | ICD-10-CM | POA: Insufficient documentation

## 2013-02-15 ENCOUNTER — Inpatient Hospital Stay (HOSPITAL_COMMUNITY)
Admission: EM | Admit: 2013-02-15 | Discharge: 2013-03-09 | DRG: 064 | Disposition: E | Payer: Medicare HMO | Attending: Internal Medicine | Admitting: Internal Medicine

## 2013-02-15 ENCOUNTER — Encounter (HOSPITAL_COMMUNITY): Payer: Self-pay | Admitting: Emergency Medicine

## 2013-02-15 ENCOUNTER — Emergency Department (HOSPITAL_COMMUNITY): Payer: Medicare HMO

## 2013-02-15 DIAGNOSIS — E119 Type 2 diabetes mellitus without complications: Secondary | ICD-10-CM | POA: Diagnosis present

## 2013-02-15 DIAGNOSIS — E039 Hypothyroidism, unspecified: Secondary | ICD-10-CM | POA: Diagnosis present

## 2013-02-15 DIAGNOSIS — T68XXXA Hypothermia, initial encounter: Secondary | ICD-10-CM

## 2013-02-15 DIAGNOSIS — I629 Nontraumatic intracranial hemorrhage, unspecified: Secondary | ICD-10-CM | POA: Insufficient documentation

## 2013-02-15 DIAGNOSIS — Z823 Family history of stroke: Secondary | ICD-10-CM

## 2013-02-15 DIAGNOSIS — E871 Hypo-osmolality and hyponatremia: Secondary | ICD-10-CM | POA: Diagnosis present

## 2013-02-15 DIAGNOSIS — Z87891 Personal history of nicotine dependence: Secondary | ICD-10-CM

## 2013-02-15 DIAGNOSIS — IMO0001 Reserved for inherently not codable concepts without codable children: Secondary | ICD-10-CM | POA: Insufficient documentation

## 2013-02-15 DIAGNOSIS — Z66 Do not resuscitate: Secondary | ICD-10-CM | POA: Diagnosis present

## 2013-02-15 DIAGNOSIS — I619 Nontraumatic intracerebral hemorrhage, unspecified: Principal | ICD-10-CM | POA: Diagnosis present

## 2013-02-15 DIAGNOSIS — J690 Pneumonitis due to inhalation of food and vomit: Secondary | ICD-10-CM | POA: Diagnosis present

## 2013-02-15 DIAGNOSIS — I10 Essential (primary) hypertension: Secondary | ICD-10-CM | POA: Diagnosis present

## 2013-02-15 DIAGNOSIS — R680 Hypothermia, not associated with low environmental temperature: Secondary | ICD-10-CM | POA: Diagnosis present

## 2013-02-15 DIAGNOSIS — R030 Elevated blood-pressure reading, without diagnosis of hypertension: Secondary | ICD-10-CM

## 2013-02-15 DIAGNOSIS — J9600 Acute respiratory failure, unspecified whether with hypoxia or hypercapnia: Secondary | ICD-10-CM | POA: Diagnosis present

## 2013-02-15 DIAGNOSIS — H543 Unqualified visual loss, both eyes: Secondary | ICD-10-CM | POA: Diagnosis present

## 2013-02-15 DIAGNOSIS — Z85819 Personal history of malignant neoplasm of unspecified site of lip, oral cavity, and pharynx: Secondary | ICD-10-CM

## 2013-02-15 DIAGNOSIS — E785 Hyperlipidemia, unspecified: Secondary | ICD-10-CM | POA: Diagnosis present

## 2013-02-15 HISTORY — DX: Unspecified visual loss: H54.7

## 2013-02-15 HISTORY — DX: Type 2 diabetes mellitus without complications: E11.9

## 2013-02-15 HISTORY — DX: Malignant (primary) neoplasm, unspecified: C80.1

## 2013-02-15 HISTORY — DX: Hypothyroidism, unspecified: E03.9

## 2013-02-15 HISTORY — DX: Hyperlipidemia, unspecified: E78.5

## 2013-02-15 LAB — DIFFERENTIAL
BASOS ABS: 0 10*3/uL (ref 0.0–0.1)
Basophils Relative: 0 % (ref 0–1)
EOS ABS: 0.1 10*3/uL (ref 0.0–0.7)
EOS PCT: 1 % (ref 0–5)
LYMPHS ABS: 1.7 10*3/uL (ref 0.7–4.0)
LYMPHS PCT: 18 % (ref 12–46)
Monocytes Absolute: 0.6 10*3/uL (ref 0.1–1.0)
Monocytes Relative: 6 % (ref 3–12)
NEUTROS PCT: 75 % (ref 43–77)
Neutro Abs: 7.3 10*3/uL (ref 1.7–7.7)

## 2013-02-15 LAB — BLOOD GAS, ARTERIAL
Acid-base deficit: 1.8 mmol/L (ref 0.0–2.0)
Bicarbonate: 22.7 mEq/L (ref 20.0–24.0)
Drawn by: 22223
FIO2: 100 %
O2 SAT: 99.8 %
PCO2 ART: 39.7 mmHg (ref 35.0–45.0)
PEEP: 5 cmH2O
Patient temperature: 37
RATE: 25 resp/min
TCO2: 20.1 mmol/L (ref 0–100)
VT: 550 mL
pH, Arterial: 7.375 (ref 7.350–7.450)
pO2, Arterial: 503 mmHg — ABNORMAL HIGH (ref 80.0–100.0)

## 2013-02-15 LAB — TROPONIN I: Troponin I: <0.3 ng/mL (ref <0.30)

## 2013-02-15 LAB — COMPREHENSIVE METABOLIC PANEL
ALK PHOS: 77 U/L (ref 39–117)
ALT: 7 U/L (ref 0–53)
AST: 15 U/L (ref 0–37)
Albumin: 3.7 g/dL (ref 3.5–5.2)
BUN: 10 mg/dL (ref 6–23)
CALCIUM: 9.4 mg/dL (ref 8.4–10.5)
CO2: 25 meq/L (ref 19–32)
Chloride: 99 mEq/L (ref 96–112)
Creatinine, Ser: 0.79 mg/dL (ref 0.50–1.35)
GFR calc Af Amer: >90 mL/min (ref >90)
GFR calc non Af Amer: 80 mL/min — ABNORMAL LOW (ref >90)
GLUCOSE: 241 mg/dL — AB (ref 70–99)
POTASSIUM: 4.1 meq/L (ref 3.7–5.3)
SODIUM: 140 meq/L (ref 137–147)
TOTAL PROTEIN: 7.1 g/dL (ref 6.0–8.3)
Total Bilirubin: 0.4 mg/dL (ref 0.3–1.2)

## 2013-02-15 LAB — URINALYSIS, ROUTINE W REFLEX MICROSCOPIC
Bilirubin Urine: NEGATIVE
Glucose, UA: 250 mg/dL — AB
Hgb urine dipstick: NEGATIVE
KETONES UR: NEGATIVE mg/dL
LEUKOCYTES UA: NEGATIVE
NITRITE: NEGATIVE
PROTEIN: NEGATIVE mg/dL
Specific Gravity, Urine: 1.02 (ref 1.005–1.030)
UROBILINOGEN UA: 0.2 mg/dL (ref 0.0–1.0)
pH: 7 (ref 5.0–8.0)

## 2013-02-15 LAB — PROTIME-INR
INR: 0.93 (ref 0.00–1.49)
PROTHROMBIN TIME: 12.3 s (ref 11.6–15.2)

## 2013-02-15 LAB — CBC
HCT: 38.9 % — ABNORMAL LOW (ref 39.0–52.0)
Hemoglobin: 13.3 g/dL (ref 13.0–17.0)
MCH: 32.6 pg (ref 26.0–34.0)
MCHC: 34.2 g/dL (ref 30.0–36.0)
MCV: 95.3 fL (ref 78.0–100.0)
PLATELETS: 208 10*3/uL (ref 150–400)
RBC: 4.08 MIL/uL — AB (ref 4.22–5.81)
RDW: 12.9 % (ref 11.5–15.5)
WBC: 9.7 10*3/uL (ref 4.0–10.5)

## 2013-02-15 LAB — RAPID URINE DRUG SCREEN, HOSP PERFORMED
AMPHETAMINES: NOT DETECTED
BARBITURATES: NOT DETECTED
BENZODIAZEPINES: NOT DETECTED
Cocaine: NOT DETECTED
Opiates: NOT DETECTED
Tetrahydrocannabinol: NOT DETECTED

## 2013-02-15 LAB — GLUCOSE, CAPILLARY: GLUCOSE-CAPILLARY: 223 mg/dL — AB (ref 70–99)

## 2013-02-15 LAB — ETHANOL

## 2013-02-15 LAB — APTT: aPTT: 24 seconds (ref 24–37)

## 2013-02-15 MED ORDER — NICARDIPINE HCL IN NACL 20-0.86 MG/200ML-% IV SOLN
5.0000 mg/h | INTRAVENOUS | Status: DC
  Filled 2013-02-15: qty 200

## 2013-02-15 MED ORDER — ONDANSETRON HCL 4 MG/2ML IJ SOLN: 4.0000 mg | Freq: Four times a day (QID) | INTRAMUSCULAR | Status: DC | PRN

## 2013-02-15 MED ORDER — SODIUM CHLORIDE 0.9 % IV SOLN
INTRAVENOUS | Status: DC
  Administered 2013-02-15: 17:00:00 via INTRAVENOUS

## 2013-02-15 MED ORDER — ALBUTEROL SULFATE (2.5 MG/3ML) 0.083% IN NEBU: 2.5000 mg | INHALATION_SOLUTION | RESPIRATORY_TRACT | Status: DC | PRN

## 2013-02-15 MED ORDER — ACETAMINOPHEN 325 MG PO TABS: 650.0000 mg | ORAL_TABLET | Freq: Four times a day (QID) | ORAL | Status: DC | PRN

## 2013-02-15 MED ORDER — FENTANYL CITRATE 0.05 MG/ML IJ SOLN
100.0000 ug | Freq: Once | INTRAMUSCULAR | Status: AC
  Administered 2013-02-15: 100 ug via INTRAVENOUS

## 2013-02-15 MED ORDER — FENTANYL CITRATE 0.05 MG/ML IJ SOLN
INTRAMUSCULAR | Status: AC
  Administered 2013-02-15: 100 ug via INTRAVENOUS
  Filled 2013-02-15: qty 2

## 2013-02-15 MED ORDER — CHLORHEXIDINE GLUCONATE 0.12 % MT SOLN: 15.0000 mL | Freq: Two times a day (BID) | OROMUCOSAL | Status: DC

## 2013-02-15 MED ORDER — FENTANYL CITRATE 0.05 MG/ML IJ SOLN: 50.0000 ug | INTRAMUSCULAR | Status: DC | PRN

## 2013-02-15 MED ORDER — LIDOCAINE HCL (CARDIAC) 20 MG/ML IV SOLN
INTRAVENOUS | Status: AC
  Filled 2013-02-15: qty 5

## 2013-02-15 MED ORDER — INSULIN ASPART 100 UNIT/ML ~~LOC~~ SOLN: 0.0000 [IU] | SUBCUTANEOUS | Status: DC

## 2013-02-15 MED ORDER — PROPOFOL 10 MG/ML IV EMUL
INTRAVENOUS | Status: AC
  Filled 2013-02-15: qty 100

## 2013-02-15 MED ORDER — SUCCINYLCHOLINE CHLORIDE 20 MG/ML IJ SOLN
INTRAMUSCULAR | Status: AC
  Administered 2013-02-15: 17:00:00 120 mg via INTRAVENOUS
  Filled 2013-02-15: qty 1

## 2013-02-15 MED ORDER — PROPOFOL 10 MG/ML IV BOLUS
40.0000 mg | Freq: Once | INTRAVENOUS | Status: AC
  Administered 2013-02-15: 40 mg via INTRAVENOUS

## 2013-02-15 MED ORDER — ONDANSETRON HCL 4 MG/2ML IJ SOLN
4.0000 mg | Freq: Once | INTRAMUSCULAR | Status: AC
  Administered 2013-02-15: 4 mg via INTRAVENOUS
  Filled 2013-02-15: qty 2

## 2013-02-15 MED ORDER — SODIUM CHLORIDE 0.9 % IV SOLN
250.0000 mg | Freq: Two times a day (BID) | INTRAVENOUS | Status: DC
  Filled 2013-02-15 (×4): qty 2.5

## 2013-02-15 MED ORDER — ROCURONIUM BROMIDE 50 MG/5ML IV SOLN
INTRAVENOUS | Status: AC
  Filled 2013-02-15: qty 2

## 2013-02-15 MED ORDER — ETOMIDATE 2 MG/ML IV SOLN
30.0000 mg | Freq: Once | INTRAVENOUS | Status: AC
  Administered 2013-02-15: 30 mg via INTRAVENOUS

## 2013-02-15 MED ORDER — LEVOTHYROXINE SODIUM 100 MCG IV SOLR
37.5000 ug | Freq: Every day | INTRAVENOUS | Status: DC
  Filled 2013-02-15 (×3): qty 5

## 2013-02-15 MED ORDER — ETOMIDATE 2 MG/ML IV SOLN
INTRAVENOUS | Status: AC
  Administered 2013-02-15: 17:00:00 30 mg via INTRAVENOUS
  Filled 2013-02-15: qty 20

## 2013-02-15 MED ORDER — BIOTENE DRY MOUTH MT LIQD: 15.0000 mL | Freq: Four times a day (QID) | OROMUCOSAL | Status: DC

## 2013-02-15 MED ORDER — MORPHINE SULFATE 4 MG/ML IJ SOLN: 4.0000 mg | INTRAMUSCULAR | Status: DC | PRN

## 2013-02-15 MED ORDER — HYDRALAZINE HCL 20 MG/ML IJ SOLN
10.0000 mg | Freq: Once | INTRAMUSCULAR | Status: AC
  Administered 2013-02-15: 10 mg via INTRAVENOUS
  Filled 2013-02-15: qty 1

## 2013-02-15 MED ORDER — SUCCINYLCHOLINE CHLORIDE 20 MG/ML IJ SOLN
1.5000 mg/kg | Freq: Once | INTRAMUSCULAR | Status: AC
  Administered 2013-02-15: 120 mg via INTRAVENOUS

## 2013-02-15 MED ORDER — PROPOFOL 10 MG/ML IV EMUL
5.0000 ug/kg/min | INTRAVENOUS | Status: DC
  Administered 2013-02-15: 5 ug/kg/min via INTRAVENOUS
  Administered 2013-02-15: 35 ug/kg/min via INTRAVENOUS
  Filled 2013-02-15: qty 100

## 2013-02-15 MED ORDER — PANTOPRAZOLE SODIUM 40 MG IV SOLR: 40.0000 mg | Freq: Every day | INTRAVENOUS | Status: DC

## 2013-02-15 MED ORDER — PIPERACILLIN-TAZOBACTAM 3.375 G IVPB
3.3750 g | Freq: Three times a day (TID) | INTRAVENOUS | Status: DC
  Filled 2013-02-15 (×6): qty 50

## 2013-02-15 MED ORDER — IPRATROPIUM BROMIDE 0.02 % IN SOLN: 0.5000 mg | RESPIRATORY_TRACT | Status: DC | PRN

## 2013-02-15 MED ORDER — SODIUM CHLORIDE 0.9 % IV SOLN: INTRAVENOUS | Status: DC

## 2013-02-15 MED ORDER — POTASSIUM CHLORIDE IN NACL 20-0.9 MEQ/L-% IV SOLN: INTRAVENOUS | Status: DC

## 2013-02-15 MED ORDER — ONDANSETRON HCL 4 MG PO TABS: 4.0000 mg | ORAL_TABLET | Freq: Four times a day (QID) | ORAL | Status: DC | PRN

## 2013-02-15 MED ORDER — ACETAMINOPHEN 650 MG RE SUPP: 650.0000 mg | Freq: Four times a day (QID) | RECTAL | Status: DC | PRN

## 2013-02-15 NOTE — Progress Notes (Signed)
ANTIBIOTIC CONSULT NOTE - INITIAL  Pharmacy Consult for Zosyn Indication: rule out pneumonia aspiration  Allergies  Allergen Reactions  . Phenergan [Promethazine Hcl] Other (See Comments)    hallucinations    Patient Measurements: Height: 5\' 9"  (175.3 cm) Weight: 154 lb 15.7 oz (70.3 kg) IBW/kg (Calculated) : 70.7  Vital Signs: Temp: 98 F (36.7 C) (01/10 1925) Temp src: Axillary (01/10 1925) BP: 197/119 mmHg (01/10 1825) Pulse Rate: 99 (01/10 1925) Intake/Output from previous day:   Intake/Output from this shift:    Labs:  Recent Labs  03/06/2013 1530  WBC 9.7  HGB 13.3  PLT 208  CREATININE 0.79   Estimated Creatinine Clearance: 68.3 ml/min (by C-G formula based on Cr of 0.79). No results found for this basename: VANCOTROUGH, VANCOPEAK, VANCORANDOM, GENTTROUGH, GENTPEAK, GENTRANDOM, TOBRATROUGH, TOBRAPEAK, TOBRARND, AMIKACINPEAK, AMIKACINTROU, AMIKACIN,  in the last 72 hours   Microbiology: No results found for this or any previous visit (from the past 720 hour(s)).  Medical History: Past Medical History  Diagnosis Date  . Diabetes mellitus without complication   . Cancer     throat cancer  . 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    Marland Kitchen Hypothyroidism   . Hyperlipidemia     Medications:  Scheduled:  . [START ON March 17, 2013] antiseptic oral rinse  15 mL Mouth Rinse QID  . chlorhexidine  15 mL Mouth Rinse BID  . insulin aspart  0-9 Units Subcutaneous Q4H  . levETIRAcetam  250 mg Intravenous Q12H  . levothyroxine  37.5 mcg Intravenous Daily  . lidocaine (cardiac) 100 mg/56ml      . pantoprazole (PROTONIX) IV  40 mg Intravenous Daily  . piperacillin-tazobactam (ZOSYN)  IV  3.375 g Intravenous Q8H  . propofol      . rocuronium       Assessment: Okay for Protocol, possible aspiration PNA, VDRF, S/P large intraparenchymal hemorrhage   Goal of Therapy:  Eradicate infection.  Plan:  Zosyn 3.375gm IV every 8 hours. Follow-up micro data, labs,  vitals.  Pricilla Larsson 02/14/2013,8:09 PM

## 2013-02-15 NOTE — ED Notes (Signed)
Wife reports pt was sitting at chair around 1230 today and started c/o headache over left eye.  Pt put some eye drops in left eye and put a cool compress over left eye.  Pt was verbal, told pt he needed to go see a doctor because the pain was so bad.  Pt requested to go to the bathroom but pt was unable to walk.  WIfe reports pt also couldn't grip with left hand.  Pt vomited once at his house, twice enroute to ed and once in ED>  Pt sounds very congested, wife says congestion started after vomiting the first time.  Pt now nonverbal.

## 2013-02-15 NOTE — ED Provider Notes (Signed)
CSN: [id number]     Arrival date & time 02/11/2013  34 History   First MD Initiated Contact with Patient 03/05/2013 1517     Chief Complaint  Patient presents with  . Code Stroke   (Consider location/radiation/quality/duration/timing/severity/associated sxs/prior Treatment) HPI Pt seen in CT scanner; patient was last known well 12:30 this afternoon sudden onset left-sided headache quickly became generally weak and nonverbal vomited a few times prior to arrival with suspected aspiration; was not ill prior to this event. Is not on anticoagulants. At baseline has diabetes with poor left eye vision was able to walk and talk normally at baseline. History of present illness and review of systems otherwise not able to be obtained due to patient's condition. Past Medical History  Diagnosis Date  . Diabetes mellitus without complication   . Cancer     throat cancer  . 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    Marland Kitchen Hypothyroidism   . Hyperlipidemia    Past Surgical History  Procedure Laterality Date  . Cholecystectomy    . Throat cancer surgery     No family history on file. History  Substance Use Topics  . Smoking status: Former Research scientist (life sciences)  . Smokeless tobacco: Not on file  . Alcohol Use: No    Review of Systems  Unable to perform ROS: Mental status change    Allergies  Phenergan  Home Medications   Current Outpatient Rx  Name  Route  Sig  Dispense  Refill  . acetaminophen (TYLENOL) 500 MG tablet   Oral   Take 1,000 mg by mouth daily as needed.         Marland Kitchen aspirin EC 81 MG tablet   Oral   Take 81 mg by mouth every morning.         . diphenhydrAMINE (BENADRYL) 25 mg capsule   Oral   Take 25 mg by mouth at bedtime.         . ferrous sulfate 325 (65 FE) MG tablet   Oral   Take 325 mg by mouth daily with breakfast.         . levothyroxine (SYNTHROID, LEVOTHROID) 75 MCG tablet   Oral   Take 75 mcg by mouth daily before breakfast.         . lisinopril (PRINIVIL,ZESTRIL)  10 MG tablet   Oral   Take 10 mg by mouth every morning.         . metFORMIN (GLUCOPHAGE) 500 MG tablet   Oral   Take by mouth 2 (two) times daily with a meal.         . Multiple Vitamin (MULTIVITAMIN WITH MINERALS) TABS tablet   Oral   Take 1 tablet by mouth every morning.         . Multiple Vitamins-Minerals (PRESERVISION AREDS 2) CAPS   Oral   Take 1 tablet by mouth every morning.         . Omega-3 Fatty Acids (FISH OIL) 1000 MG CAPS   Oral   Take 1,000 mg by mouth every morning.         . Saw Palmetto, Serenoa repens, 450 MG CAPS   Oral   Take 450 mg by mouth daily.         . simvastatin (ZOCOR) 40 MG tablet   Oral   Take 40 mg by mouth at bedtime.         . vitamin C (ASCORBIC ACID) 500 MG tablet   Oral  Take 500 mg by mouth every morning.          BP 46/33  Pulse 44  Temp(Src) 98 F (36.7 C) (Axillary)  Resp 12  Ht 5\' 9"  (1.753 m)  Wt 154 lb 15.7 oz (70.3 kg)  BMI 22.88 kg/m2  SpO2 83% Physical Exam  Nursing note and vitals reviewed. Constitutional:  Eyes closed, nonverbal, flaccid except slight withdrawal left hand  HENT:  Head: Atraumatic.  Eyes: Right eye exhibits no discharge. Left eye exhibits no discharge.  Anisocoria; left pupil dilated nonreactive right pupil small reactive; unable to assess extraocular movements or peripheral visual fields   Neck: Neck supple.  Cardiovascular: Normal rate and regular rhythm.   No murmur heard. Pulmonary/Chest: He is in respiratory distress. He has no wheezes. He has no rales. He exhibits no tenderness.  Diffuse rhonchi bilaterally retractions and accessory muscle usage pulse oximetry normal room air 100%  Abdominal: Soft. He exhibits no distension. There is no tenderness. There is no rebound and no guarding.  Musculoskeletal: He exhibits no edema and no tenderness.  Flaccid except slight movement left hand  Neurological:  Eyes closed, left pupil dilated nonreactive, nonverbal, poor gag reflex,  only movement is slight withdrawal left hand to pain and slight nonpurposeful spontaneous movement left hand  Skin: No rash noted.  Psychiatric: He has a normal mood and affect.    ED Course  Procedures (including critical care time) Multiple discussions with wife and daughter, [place] neurosurgery unavailable due to trauma, discussed with [facility] neurosurgery who agrees neurosurgical intervention unlikely to improve patient outcome, discussed possible comfort care palliative care repeatedly with wife and daughter, after deliberation wife requested patient be intubated and transferred to [facility] so neuro hospitalist and critical care paged after intubation D/w CCM, [facility] and [facility] (finished trauma), all agree neurosurg intervention not indicated, suspect fatal bleed, dismal outcome likely if Pt survives, CCM will manage vent at [facility] ICU via e-link and [facility] will admit Pt, for now Pt on vent and not DNR but family still deliberating code decision, family offered transfer to [facility] but OK to remain at [facility] since no neuro intervention indicated. Family prefers Pt remain at [facility].Family / Caregiver informed of clinical course, understand medical decision-making process, and agree with plan.  RSI: Rapid sequence intubation, timeout taken, video scope used, one attempt with size 8 endotracheal tube inserted to depth 24 cm good visualization positive end tidal CO2 obtained pulse oximetry remained 100% prior to during and post procedure, patient received high flow nasal cannula oxygen pre procedure and during procedure in addition to his oxygen mask, patient tolerated well with no apparent immediate complications, CXR ordered.  CRITICAL CARE Performed by: Babette Relic Total critical care time: 21min including discussions with family and consultants Critical care time was exclusive of separately billable procedures and treating other patients. Critical care was necessary to treat or  prevent imminent or life-threatening deterioration. Critical care was time spent personally by me on the following activities: development of treatment plan with patient and/or surrogate as well as nursing, discussions with consultants, evaluation of patient's response to treatment, examination of patient, obtaining history from patient or surrogate, ordering and performing treatments and interventions, ordering and review of laboratory studies, ordering and review of radiographic studies, pulse oximetry and re-evaluation of patient's condition. Labs Review Labs Reviewed  GLUCOSE, CAPILLARY - Abnormal; Notable for the following:    Glucose-Capillary 223 (*)    All other components within normal limits  CBC - Abnormal; Notable for the following:    RBC 4.08 (*)    HCT 38.9 (*)    All other components within normal limits  COMPREHENSIVE METABOLIC PANEL - Abnormal; Notable for the following:    Glucose, Bld 241 (*)    GFR calc non Af Amer 80 (*)    All other components within normal limits  URINALYSIS, ROUTINE W REFLEX MICROSCOPIC - Abnormal; Notable for the following:    Glucose, UA 250 (*)    All other components within normal limits  BLOOD GAS, ARTERIAL - Abnormal; Notable for the following:    pO2, Arterial 503.0 (*)    All other components within normal limits  PROTIME-INR  APTT  DIFFERENTIAL  URINE RAPID DRUG SCREEN (HOSP PERFORMED)  ETHANOL  TROPONIN I   Imaging Review Ct Head Wo Contrast  02/14/2013   CLINICAL DATA:  Stroke-like symptoms, headache  EXAM: CT HEAD WITHOUT CONTRAST  TECHNIQUE: Contiguous axial images were obtained from the base of the skull through the vertex without intravenous contrast.  COMPARISON:  None.  FINDINGS: The bony calvarium is intact. No gross soft tissue abnormality is noted. There is a significant intraparenchymal hemorrhage identified on the left. This measures at least 9.3 by a 4.7 cm in greatest AP and transverse dimensions respectively. It is  center predominately in the left temporal and posterior parietal lobes. Surrounding white matter edema is noted. There also appears to the a subdural component on the left. This measures approximately 15 mm in greatest dimension. There is significant mass effect upon the left lateral ventricle with midline shift from left to right upper approximately 10 mm. No definitive subarachnoid component is noted at this time.  IMPRESSION: Large intraparenchymal hemorrhage with surrounding white matter edema is constipated predominately in the left temporal and posterior parietal lobes as described. There is significant mass effect with midline shift from left to right as described. Additionally a large subdural component measuring 15 mm in thickness is noted on the left predominantly along the lateral aspect of the left temporal lobe and left frontal lobe.  These results were called by telephone at the time of interpretation on 02/27/2013 at 3:36 PM to Dr. Riki Altes , who verbally acknowledged these results.   Electronically Signed   By: Inez Catalina M.D.   On: 02/20/2013 15:36   Dg Chest Portable 1 View  02/20/2013   CLINICAL DATA:  Endotracheal tube placement.  EXAM: PORTABLE CHEST - 1 VIEW  COMPARISON:  December 12, 2005.  FINDINGS: Stable cardiomediastinal silhouette. Endotracheal tube is projected over tracheal air shadow with distal tip 5 cm above the carina. Nasogastric tube is seen entering the stomach. No pneumothorax is noted. Stable bilateral basilar opacities are noted.  IMPRESSION: Endotracheal and nasogastric tubes in grossly good position. Stable bilateral basilar opacities.   Electronically Signed   By: Sabino Dick M.D.   On: 02/14/2013 17:07    EKG Interpretation    Date/Time:    Ventricular Rate:    PR Interval:    QRS Duration:   QT Interval:    QTC Calculation:   R Axis:     Text Interpretation:            Muse not available: ECG: Sinus rhythm with artifact, ventricular rate 77,  wide complex QRS 138 ms, nonspecific intraventricular block, normal axis, nonspecific ST-T changes, no comparison ECG available  MDM   1. Intracerebral hemorrhage   2. Acute respiratory failure   3. Aspiration pneumonia  4. DM type 2 (diabetes mellitus, type 2)    Family understands death in hours or days will likely occur.    Babette Relic, MD 03/07/2013 1322

## 2013-02-15 NOTE — Progress Notes (Signed)
Pt on pressure control unable to ventilate on prvc

## 2013-02-15 NOTE — ED Notes (Signed)
Dr. Stevie Kern in room to begin intubation at 16:48. Time-out done at that time. 30mg  of Etomidate given IV at 16:49. 120mg  of Succinylcholine given IV at 16:50. 8.51mm ETT position at 24cm at the gum with positive color change and equal bilateral rhonchi at 16:51. OG 17fr tube placed at 16:55 with ascultation to confirm placement. Radiology paged to confirm placement of ETT and OG. 139mcg of Fentanyl given at 16:55. Propofol drip started at 28mcg/kg/min at 17:02. Rate of propofol drip increased to 19mcg/kg/min at 17:06 due to pt agitation. Chest x-ray resulted at 17:07 and confirmed placement of ETT and OG. 40mg  rapid bolus of Propofol given at 17:13 due to pt agitation.

## 2013-02-15 NOTE — H&P (Signed)
[facility] History and Physical  [name] [id number] DOB: 02/21/1926 DOA: 02/11/2013  Referring physician: ED physician, Dr. Stevie Kern PCP: Delphina Cahill, MD   Chief Complaint: Intracranial bleed.  HPI: [name] is a 80 y.o. male with a history of diabetes mellitus and throat cancer (18 years ago) who presented to the emergency department after complaining of a severe left-sided headache. The patient is currently intubated and is unable to provide history. The history is being provided by his wife. Accordingly, the patient was in his usual state of health when early this afternoon, he complained of left-sided eye pain that radiated to his head. He took Tylenol but it did not help. He asked his wife to help him to the bathroom and his wife realize that the patient could barely walk. This was new. Eventually, her nephew had to lift the patient to put him in the car to bring him to the emergency department. In route, apparently he vomited a couple of times. Prior to today, he had no complaints of fever, chills, cough, chest pain, shortness of breath, palpitations, upper respiratory infection symptoms, abdominal pain, nausea, vomiting, diarrhea, pain with urination, or swelling in his legs. His blood glucose has been relatively controlled. He takes lisinopril for presumed nephro protection. His wife says that his blood pressure is usually well controlled.  In the emergency department, his blood pressure ranged from 138/86-210/100. His lab data are significant for glucose of 241. CT of his head revealed a large intraparenchymal hemorrhage with surrounding white matter edema, significant mass effect, a large subdural component measuring 15 mm in thickness, et Ronney Asters. Emergency department physician Dr. Stevie Kern discussed the findings with the family after he discussed the findings with neurosurgery at [facility] and [facility]. Given his clinical presentation, neurosurgery did not believe  surgical intervention would likely to improve the patient's outcome. This was discussed with the family who preferred to keep the patient at [facility] pending CODE STATUS discussion. The patient was intubated in the ED for airway protection in the setting of a catastrophic intracranial hemorrhage. He is being admitted for further evaluation and management.     Review of Systems:  As above in history present illness.     Past Medical History  Diagnosis Date  . Diabetes mellitus without complication   . Cancer     throat cancer  . 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    Marland Kitchen Hypothyroidism   . Hyperlipidemia   Hyperlipidemia Hypothyroidism   Past Surgical History  Procedure Laterality Date  . Cholecystectomy    . Throat cancer surgery     Social History: The patient is married. He has 2 children. He is a retired Psychologist, sport and exercise. He quit smoking approximately 15 years ago. He has no history of alcohol or illicit drug use.   Allergies  Allergen Reactions  . Phenergan [Promethazine Hcl] Other (See Comments)    hallucinations    Social history: His mother died of a cerebral hemorrhage as a young woman. The etiology of his father's death is unknown.  Prior to Admission medications   Medication Sig Start Date End Date Taking? Authorizing Provider  acetaminophen (TYLENOL) 500 MG tablet Take 1,000 mg by mouth daily as needed.   Yes Historical Provider, MD  aspirin EC 81 MG tablet Take 81 mg by mouth every morning.   Yes Historical Provider, MD  diphenhydrAMINE (BENADRYL) 25 mg capsule Take 25 mg by mouth at bedtime.   Yes  Historical Provider, MD  ferrous sulfate 325 (65 FE) MG tablet Take 325 mg by mouth daily with breakfast.   Yes Historical Provider, MD  levothyroxine (SYNTHROID, LEVOTHROID) 75 MCG tablet Take 75 mcg by mouth daily before breakfast.   Yes Historical Provider, MD  lisinopril (PRINIVIL,ZESTRIL) 10 MG tablet Take 10 mg by mouth every morning.   Yes Historical  Provider, MD  metFORMIN (GLUCOPHAGE) 500 MG tablet Take by mouth 2 (two) times daily with a meal.   Yes Historical Provider, MD  Multiple Vitamin (MULTIVITAMIN WITH MINERALS) TABS tablet Take 1 tablet by mouth every morning.   Yes Historical Provider, MD  Multiple Vitamins-Minerals (PRESERVISION AREDS 2) CAPS Take 1 tablet by mouth every morning.   Yes Historical Provider, MD  Omega-3 Fatty Acids (FISH OIL) 1000 MG CAPS Take 1,000 mg by mouth every morning.   Yes Historical Provider, MD  Saw Palmetto, Serenoa repens, 450 MG CAPS Take 450 mg by mouth daily.   Yes Historical Provider, MD  simvastatin (ZOCOR) 40 MG tablet Take 40 mg by mouth at bedtime.   Yes Historical Provider, MD  vitamin C (ASCORBIC ACID) 500 MG tablet Take 500 mg by mouth every morning.   Yes Historical Provider, MD   Physical Exam: Filed Vitals:   02-20-13 1925  BP:   Pulse: 99  Temp: 98 F (36.7 C)  Resp: 36    BP 197/119  Pulse 99  Temp(Src) 98 F (36.7 C) (Axillary)  Resp 36  Ht 5\' 9"  (1.753 m)  Wt 70.3 kg (154 lb 15.7 oz)  BMI 22.88 kg/m2  SpO2 100%  General:  He is intubated and sedated with propofol Eyes: PERRL, normal lids, irises & conjunctiva ENT: ET tube inserted. Oropharynx appears dry. Neck: no LAD, masses or thyromegaly Cardiovascular: S1, S2, with borderline tachycardia. No LE edema. Telemetry: S1, S2, with tachycardia. Respiratory: Coarse breath sounds bilaterally.. Abdomen: Soft, nontender, nondistended, positive bowel sounds. Skin: no rash or induration seen on limited exam Musculoskeletal: grossly normal tone BUE/BLE Psychiatric: obtunded Neurologic: Intubated and sedated on propofol. Pupils dilated. Plantar reflexes with up going toes bilaterally. Could not r reliably detect cranial nerve deficits.           Labs on Admission:  Basic Metabolic Panel:  Recent Labs Lab 02/20/13 1530  NA 140  K 4.1  CL 99  CO2 25  GLUCOSE 241*  BUN 10  CREATININE 0.79  CALCIUM 9.4   Liver  Function Tests:  Recent Labs Lab 2013-02-20 1530  AST 15  ALT 7  ALKPHOS 77  BILITOT 0.4  PROT 7.1  ALBUMIN 3.7   No results found for this basename: LIPASE, AMYLASE,  in the last 168 hours No results found for this basename: AMMONIA,  in the last 168 hours CBC:  Recent Labs Lab 02-20-2013 1530  WBC 9.7  NEUTROABS 7.3  HGB 13.3  HCT 38.9*  MCV 95.3  PLT 208   Cardiac Enzymes:  Recent Labs Lab February 20, 2013 1530  TROPONINI <0.30    BNP (last 3 results) No results found for this basename: PROBNP,  in the last 8760 hours CBG:  Recent Labs Lab Feb 20, 2013 1510  GLUCAP 223*    Radiological Exams on Admission: Ct Head Wo Contrast  20-Feb-2013   CLINICAL DATA:  Stroke-like symptoms, headache  EXAM: CT HEAD WITHOUT CONTRAST  TECHNIQUE: Contiguous axial images were obtained from the base of the skull through the vertex without intravenous contrast.  COMPARISON:  None.  FINDINGS: The bony calvarium is  intact. No gross soft tissue abnormality is noted. There is a significant intraparenchymal hemorrhage identified on the left. This measures at least 9.3 by a 4.7 cm in greatest AP and transverse dimensions respectively. It is center predominately in the left temporal and posterior parietal lobes. Surrounding white matter edema is noted. There also appears to the a subdural component on the left. This measures approximately 15 mm in greatest dimension. There is significant mass effect upon the left lateral ventricle with midline shift from left to right upper approximately 10 mm. No definitive subarachnoid component is noted at this time.  IMPRESSION: Large intraparenchymal hemorrhage with surrounding white matter edema is constipated predominately in the left temporal and posterior parietal lobes as described. There is significant mass effect with midline shift from left to right as described. Additionally a large subdural component measuring 15 mm in thickness is noted on the left predominantly  along the lateral aspect of the left temporal lobe and left frontal lobe.  These results were called by telephone at the time of interpretation on 2013-03-10 at 3:36 PM to Dr. Riki Altes , who verbally acknowledged these results.   Electronically Signed   By: Inez Catalina M.D.   On: 2013/03/10 15:36   Dg Chest Portable 1 View  10-Mar-2013   CLINICAL DATA:  Endotracheal tube placement.  EXAM: PORTABLE CHEST - 1 VIEW  COMPARISON:  December 12, 2005.  FINDINGS: Stable cardiomediastinal silhouette. Endotracheal tube is projected over tracheal air shadow with distal tip 5 cm above the carina. Nasogastric tube is seen entering the stomach. No pneumothorax is noted. Stable bilateral basilar opacities are noted.  IMPRESSION: Endotracheal and nasogastric tubes in grossly good position. Stable bilateral basilar opacities.   Electronically Signed   By: Sabino Dick M.D.   On: 03-10-2013 17:07    EKG: Independently reviewed.  Assessment/Plan Principal Problem:   Intracerebral hemorrhage Active Problems:   Acute respiratory failure   DM type 2 (diabetes mellitus, type 2)   Hypothermia   Malignant hypertension   Unspecified hypothyroidism   Aspiration pneumonia   32. 80 year old presents with a catastrophic intracranial hemorrhage query hemorrhagic stroke. His blood pressures have been in the malignant range which could be a manifestation of the cerebral hemorrhage. His wife reports no significant uncontrolled hypertension and that lisinopril was prescribed for kidney protection and diabetes. His prognosis is very poor. This was discussed with his wife and his daughter. Following discussion, he was made to be a DO NOT RESUSCITATE. He was intubated in the ED when the family was undecided about CODE STATUS. He will remain intubated. He will be treated over the next few days and if there is no neurological improvement, I would recommend comfort care. This was discussed with the family who appears to be in agreement.  He has bibasilar opacities on chest x-ray which may be secondary to aspiration pneumonia given 2 episodes of vomiting in route to the hospital. His hypothermic at may be secondary to the intracranial hemorrhage rather than sepsis.    Plan: 1. Continue ventilator management. I have discussed the patient with intensivist, Dr. Elsworth Soho at [facility]. He will consult remotely and make adjustments on the ventilator as needed. Tomorrow, I will consult pulmonologist Dr. Luan Pulling for his input. 2. Sedation protocol with Proprofol. Pain management with as needed fentanyl or morphine. 3. Start a Cardene drip with a goal of MAP between 100 -110. 4. Start Zosyn empirically. 5. Start seizure prophylaxis with Keppra. 6. Sliding scale NovoLog for treatment  of diabetes. 7. IV Synthroid for hypothyroidism. 8. SCDs for DVT prophylaxis. 9. Protonix for peptic ulcer disease prophylaxis.   Code Status: Now DO NOT RESUSCITATE as discussed with the patient's wife and daughter. Family Communication: discussed with the patient's wife and daughter. Disposition Plan: poor prognosis. To be determined.  Time spent: total time including critical care time in discussion with family and specialists, one hour and 30 minutes.  [facility] Pager [phone]

## 2013-02-18 NOTE — Progress Notes (Signed)
UR chart review completed.  

## 2013-03-09 NOTE — Discharge Summary (Signed)
Death Summary  [name] NKN:[id number] DOB: 06-26-1926 DOA: 03/16/13  PCP: Delphina Cahill, MD PCP/Office notified: Will be on 02/17/2013.  Admit date: 03-16-2013 Date of Death: 03-17-2013  Final Diagnoses:  1. Catastrophic intracranial hemorrhage. 2. Acute respiratory failure. 3. Suspicion of aspiration pneumonia. 4. Malignant hypertension. 5. Hyponatremia.   History of present illness:  (This death summary is being dictated approximately 24 hours after the patient was admitted. The patient expired early this morning just after midnight, following admission. Information about what transpired following admission is limited and was gleaned from the electronic chart).  [name] is a 80 y.o. male with a history of diabetes mellitus and throat cancer (18 years ago) who presented to the emergency department after complaining of a severe left-sided headache. The patient was intubated in the ED, presumably for airway protection and was unable to provide history. The history was provided by his wife. Accordingly, the patient was in his usual state of health when he complained of left-sided eye pain that radiated to his head. He took Tylenol but it did not help. He asked his wife to help him to the bathroom and his wife realized that he could barely walk. This was new. Eventually, her nephew had to lift the patient to put him in the car to bring him to the emergency department. In route, apparently he vomited a couple of times. Previously, he had no complaints of fever, chills, cough, chest pain, shortness of breath, palpitations, upper respiratory infection symptoms, abdominal pain, nausea, vomiting, diarrhea, pain with urination, or swelling in his legs. His blood glucose had been relatively controlled. He took lisinopril for presumed nephro protection. His wife stated that his blood pressure was usually well controlled.  In the emergency department, his blood pressure ranged from 138/86-210/100.  His lab data were significant for glucose of 241. CT of his head revealed a large intraparenchymal hemorrhage with surrounding white matter edema, significant mass effect, a large subdural component measuring 15 mm in thickness, et Ronney Asters. Emergency department physician Dr. Stevie Kern discussed the findings with the family after he discussed the findings with neurosurgery at [facility] and [facility] Given his clinical presentation, neurosurgery did not believe surgical intervention would likely improve the patient's outcome. This was discussed with the family who preferred to keep the patient at [facility] pending CODE STATUS discussion. The patient was intubated in the ED for airway protection in the setting of a catastrophic intracranial hemorrhage. He was admitted for further evaluation and management.    Hospital Course:   The family was informed of the patient's very grave prognosis in the setting of a catastrophic acute cranial hemorrhage. CODE STATUS was once again discussed with family members who were not available during the initial conversation. This was discussed with the patient's wife and daughter. They were both in agreement that the patient should be a DO NOT RESUSCITATE, but was willing to keep him intubated for the next 48 hours and then would consider comfort care and/or terminal extubation. The patient was subsequently transferred to the ICU on the ventilator. I discussed initial management with intensivist, Dr. Elsworth Soho at [facility]. He agreed with my initial medical management. He agreed to assist with ventilator management and settings overnight pending pulmonary consultation with Dr. Luan Pulling the next morning.  The patient was also restarted on Zosyn empirically for possible aspiration pneumonia. His chest x-ray following intubation revealed bibasilar opacities. Keppra was also started for seizure prophylaxis. IV fluids were started for  hydration. Sliding scale  NovoLog was ordered for treatment of diabetes. Fentanyl or morphine was ordered if needed for pain or agitation. A Cardene drip was ordered with a goal to keep his MAP. between 101-110. It is unclear if the Cardene drip was actually started or initiated.  Per the electronic chart, at approximately 12:15 AM on 02/24/2013, the patient's blood pressure and heart rate had decreased. Subsequently, at 12:30 AM, the registered nurse reported no blood pressure and no heart rate. The patient apparently expired then.     Signed:  Jossiah Smoak  [facility] 02/27/2013, 6:09 PM

## 2013-03-09 NOTE — Progress Notes (Signed)
md notified of decreasing bp and hr.

## 2013-03-09 NOTE — Progress Notes (Signed)
No bp, no hr verified by two rn. md aware.

## 2013-03-09 DEATH — deceased
